# Patient Record
Sex: Male | Born: 1945 | Race: White | Hispanic: No | Marital: Single | Smoking: Current some day smoker
Health system: Southern US, Community
[De-identification: ages and names within clinical notes are randomized; demographics above are authoritative.]

## PROBLEM LIST (undated history)

## (undated) DIAGNOSIS — K219 Gastro-esophageal reflux disease without esophagitis: Secondary | ICD-10-CM

## (undated) DIAGNOSIS — J449 Chronic obstructive pulmonary disease, unspecified: Secondary | ICD-10-CM

---

## 2011-04-16 DIAGNOSIS — I739 Peripheral vascular disease, unspecified: Secondary | ICD-10-CM | POA: Diagnosis not present

## 2011-04-16 DIAGNOSIS — B351 Tinea unguium: Secondary | ICD-10-CM | POA: Diagnosis not present

## 2011-05-09 DIAGNOSIS — F411 Generalized anxiety disorder: Secondary | ICD-10-CM | POA: Diagnosis not present

## 2011-05-09 DIAGNOSIS — K219 Gastro-esophageal reflux disease without esophagitis: Secondary | ICD-10-CM | POA: Diagnosis not present

## 2011-05-09 DIAGNOSIS — J438 Other emphysema: Secondary | ICD-10-CM | POA: Diagnosis not present

## 2011-05-09 DIAGNOSIS — J449 Chronic obstructive pulmonary disease, unspecified: Secondary | ICD-10-CM | POA: Diagnosis not present

## 2011-06-25 DIAGNOSIS — B351 Tinea unguium: Secondary | ICD-10-CM | POA: Diagnosis not present

## 2011-06-25 DIAGNOSIS — I739 Peripheral vascular disease, unspecified: Secondary | ICD-10-CM | POA: Diagnosis not present

## 2011-08-09 DIAGNOSIS — E782 Mixed hyperlipidemia: Secondary | ICD-10-CM | POA: Diagnosis not present

## 2011-08-09 DIAGNOSIS — J449 Chronic obstructive pulmonary disease, unspecified: Secondary | ICD-10-CM | POA: Diagnosis not present

## 2011-09-03 DIAGNOSIS — B351 Tinea unguium: Secondary | ICD-10-CM | POA: Diagnosis not present

## 2011-09-03 DIAGNOSIS — E109 Type 1 diabetes mellitus without complications: Secondary | ICD-10-CM | POA: Diagnosis not present

## 2011-11-11 DIAGNOSIS — J449 Chronic obstructive pulmonary disease, unspecified: Secondary | ICD-10-CM | POA: Diagnosis not present

## 2012-03-12 DIAGNOSIS — J449 Chronic obstructive pulmonary disease, unspecified: Secondary | ICD-10-CM | POA: Diagnosis not present

## 2012-07-10 DIAGNOSIS — J449 Chronic obstructive pulmonary disease, unspecified: Secondary | ICD-10-CM | POA: Diagnosis not present

## 2012-10-13 DIAGNOSIS — J449 Chronic obstructive pulmonary disease, unspecified: Secondary | ICD-10-CM | POA: Diagnosis not present

## 2012-10-30 DIAGNOSIS — I739 Peripheral vascular disease, unspecified: Secondary | ICD-10-CM | POA: Diagnosis not present

## 2012-10-30 DIAGNOSIS — L851 Acquired keratosis [keratoderma] palmaris et plantaris: Secondary | ICD-10-CM | POA: Diagnosis not present

## 2012-10-30 DIAGNOSIS — L609 Nail disorder, unspecified: Secondary | ICD-10-CM | POA: Diagnosis not present

## 2012-10-30 DIAGNOSIS — B351 Tinea unguium: Secondary | ICD-10-CM | POA: Diagnosis not present

## 2012-10-30 DIAGNOSIS — E1149 Type 2 diabetes mellitus with other diabetic neurological complication: Secondary | ICD-10-CM | POA: Diagnosis not present

## 2013-01-01 DIAGNOSIS — L851 Acquired keratosis [keratoderma] palmaris et plantaris: Secondary | ICD-10-CM | POA: Diagnosis not present

## 2013-01-01 DIAGNOSIS — E1149 Type 2 diabetes mellitus with other diabetic neurological complication: Secondary | ICD-10-CM | POA: Diagnosis not present

## 2013-01-01 DIAGNOSIS — L609 Nail disorder, unspecified: Secondary | ICD-10-CM | POA: Diagnosis not present

## 2013-01-14 DIAGNOSIS — Z125 Encounter for screening for malignant neoplasm of prostate: Secondary | ICD-10-CM | POA: Diagnosis not present

## 2013-01-14 DIAGNOSIS — J449 Chronic obstructive pulmonary disease, unspecified: Secondary | ICD-10-CM | POA: Diagnosis not present

## 2013-04-16 DIAGNOSIS — J449 Chronic obstructive pulmonary disease, unspecified: Secondary | ICD-10-CM | POA: Diagnosis not present

## 2013-05-07 DIAGNOSIS — B351 Tinea unguium: Secondary | ICD-10-CM | POA: Diagnosis not present

## 2013-05-07 DIAGNOSIS — L851 Acquired keratosis [keratoderma] palmaris et plantaris: Secondary | ICD-10-CM | POA: Diagnosis not present

## 2013-07-20 DIAGNOSIS — J449 Chronic obstructive pulmonary disease, unspecified: Secondary | ICD-10-CM | POA: Diagnosis not present

## 2013-07-20 DIAGNOSIS — J069 Acute upper respiratory infection, unspecified: Secondary | ICD-10-CM | POA: Diagnosis not present

## 2013-07-23 DIAGNOSIS — B351 Tinea unguium: Secondary | ICD-10-CM | POA: Diagnosis not present

## 2013-07-23 DIAGNOSIS — L851 Acquired keratosis [keratoderma] palmaris et plantaris: Secondary | ICD-10-CM | POA: Diagnosis not present

## 2013-07-23 DIAGNOSIS — E1149 Type 2 diabetes mellitus with other diabetic neurological complication: Secondary | ICD-10-CM | POA: Diagnosis not present

## 2013-08-12 ENCOUNTER — Other Ambulatory Visit (HOSPITAL_COMMUNITY): Payer: Self-pay | Admitting: Internal Medicine

## 2013-08-12 ENCOUNTER — Ambulatory Visit (HOSPITAL_COMMUNITY)
Admission: RE | Admit: 2013-08-12 | Discharge: 2013-08-12 | Disposition: A | Discharge status: Home / Self Care | Payer: Medicare Other | Source: Ambulatory Visit | Attending: Internal Medicine | Admitting: Internal Medicine

## 2013-08-12 DIAGNOSIS — R05 Cough: Secondary | ICD-10-CM | POA: Diagnosis not present

## 2013-08-12 DIAGNOSIS — R059 Cough, unspecified: Secondary | ICD-10-CM

## 2013-10-19 DIAGNOSIS — J449 Chronic obstructive pulmonary disease, unspecified: Secondary | ICD-10-CM | POA: Diagnosis not present

## 2013-10-19 DIAGNOSIS — IMO0002 Reserved for concepts with insufficient information to code with codable children: Secondary | ICD-10-CM | POA: Diagnosis not present

## 2013-12-07 DIAGNOSIS — L851 Acquired keratosis [keratoderma] palmaris et plantaris: Secondary | ICD-10-CM | POA: Diagnosis not present

## 2013-12-07 DIAGNOSIS — E1142 Type 2 diabetes mellitus with diabetic polyneuropathy: Secondary | ICD-10-CM | POA: Diagnosis not present

## 2013-12-07 DIAGNOSIS — B351 Tinea unguium: Secondary | ICD-10-CM | POA: Diagnosis not present

## 2014-01-18 DIAGNOSIS — J441 Chronic obstructive pulmonary disease with (acute) exacerbation: Secondary | ICD-10-CM | POA: Diagnosis not present

## 2014-01-18 DIAGNOSIS — Z0000 Encounter for general adult medical examination without abnormal findings: Secondary | ICD-10-CM | POA: Diagnosis not present

## 2014-01-18 DIAGNOSIS — Z125 Encounter for screening for malignant neoplasm of prostate: Secondary | ICD-10-CM | POA: Diagnosis not present

## 2014-03-01 DIAGNOSIS — B351 Tinea unguium: Secondary | ICD-10-CM | POA: Diagnosis not present

## 2014-03-01 DIAGNOSIS — L851 Acquired keratosis [keratoderma] palmaris et plantaris: Secondary | ICD-10-CM | POA: Diagnosis not present

## 2014-03-01 DIAGNOSIS — E1142 Type 2 diabetes mellitus with diabetic polyneuropathy: Secondary | ICD-10-CM | POA: Diagnosis not present

## 2014-05-03 DIAGNOSIS — J441 Chronic obstructive pulmonary disease with (acute) exacerbation: Secondary | ICD-10-CM | POA: Diagnosis not present

## 2014-05-10 DIAGNOSIS — L851 Acquired keratosis [keratoderma] palmaris et plantaris: Secondary | ICD-10-CM | POA: Diagnosis not present

## 2014-05-10 DIAGNOSIS — E1142 Type 2 diabetes mellitus with diabetic polyneuropathy: Secondary | ICD-10-CM | POA: Diagnosis not present

## 2014-05-10 DIAGNOSIS — B351 Tinea unguium: Secondary | ICD-10-CM | POA: Diagnosis not present

## 2014-07-22 DIAGNOSIS — B351 Tinea unguium: Secondary | ICD-10-CM | POA: Diagnosis not present

## 2014-07-22 DIAGNOSIS — L851 Acquired keratosis [keratoderma] palmaris et plantaris: Secondary | ICD-10-CM | POA: Diagnosis not present

## 2014-07-22 DIAGNOSIS — E1142 Type 2 diabetes mellitus with diabetic polyneuropathy: Secondary | ICD-10-CM | POA: Diagnosis not present

## 2014-08-05 DIAGNOSIS — J441 Chronic obstructive pulmonary disease with (acute) exacerbation: Secondary | ICD-10-CM | POA: Diagnosis not present

## 2014-08-05 DIAGNOSIS — K210 Gastro-esophageal reflux disease with esophagitis: Secondary | ICD-10-CM | POA: Diagnosis not present

## 2014-08-05 DIAGNOSIS — E782 Mixed hyperlipidemia: Secondary | ICD-10-CM | POA: Diagnosis not present

## 2014-09-08 ENCOUNTER — Emergency Department (HOSPITAL_COMMUNITY)
Admission: EM | Admit: 2014-09-08 | Discharge: 2014-09-08 | Disposition: A | Discharge status: Home / Self Care | Payer: Medicare Other | Attending: Emergency Medicine | Admitting: Emergency Medicine

## 2014-09-08 ENCOUNTER — Encounter (HOSPITAL_COMMUNITY): Payer: Self-pay | Admitting: Emergency Medicine

## 2014-09-08 ENCOUNTER — Emergency Department (HOSPITAL_COMMUNITY): Payer: Medicare Other

## 2014-09-08 DIAGNOSIS — Y9289 Other specified places as the place of occurrence of the external cause: Secondary | ICD-10-CM | POA: Diagnosis not present

## 2014-09-08 DIAGNOSIS — Z720 Tobacco use: Secondary | ICD-10-CM | POA: Diagnosis not present

## 2014-09-08 DIAGNOSIS — Y9389 Activity, other specified: Secondary | ICD-10-CM | POA: Diagnosis not present

## 2014-09-08 DIAGNOSIS — J449 Chronic obstructive pulmonary disease, unspecified: Secondary | ICD-10-CM | POA: Diagnosis not present

## 2014-09-08 DIAGNOSIS — K219 Gastro-esophageal reflux disease without esophagitis: Secondary | ICD-10-CM | POA: Diagnosis not present

## 2014-09-08 DIAGNOSIS — M25512 Pain in left shoulder: Secondary | ICD-10-CM | POA: Diagnosis not present

## 2014-09-08 DIAGNOSIS — S42252A Displaced fracture of greater tuberosity of left humerus, initial encounter for closed fracture: Secondary | ICD-10-CM | POA: Insufficient documentation

## 2014-09-08 DIAGNOSIS — Y998 Other external cause status: Secondary | ICD-10-CM | POA: Diagnosis not present

## 2014-09-08 DIAGNOSIS — S4992XA Unspecified injury of left shoulder and upper arm, initial encounter: Secondary | ICD-10-CM | POA: Diagnosis present

## 2014-09-08 DIAGNOSIS — Z79899 Other long term (current) drug therapy: Secondary | ICD-10-CM | POA: Diagnosis not present

## 2014-09-08 DIAGNOSIS — W1789XA Other fall from one level to another, initial encounter: Secondary | ICD-10-CM | POA: Diagnosis not present

## 2014-09-08 DIAGNOSIS — S42302A Unspecified fracture of shaft of humerus, left arm, initial encounter for closed fracture: Secondary | ICD-10-CM

## 2014-09-08 HISTORY — DX: Gastro-esophageal reflux disease without esophagitis: K21.9

## 2014-09-08 HISTORY — DX: Chronic obstructive pulmonary disease, unspecified: J44.9

## 2014-09-08 MED ORDER — HYDROCODONE-ACETAMINOPHEN 5-325 MG PO TABS
1.0000 | ORAL_TABLET | Freq: Once | ORAL | Status: AC
  Administered 2014-09-08: 1 via ORAL
  Filled 2014-09-08: qty 1

## 2014-09-08 MED ORDER — HYDROCODONE-ACETAMINOPHEN 5-325 MG PO TABS: 1.0000 | ORAL_TABLET | ORAL | Status: DC | PRN

## 2014-09-08 NOTE — ED Notes (Signed)
Pt fell today.  Injury to left arm.  Rates pain 5/10.  Pt is from [facility].

## 2014-09-08 NOTE — ED Provider Notes (Signed)
Patient seen and examined.  History of a fall from standing height today.  Exam with TTP at LUE near shoulder.  + sensation to axillary nerve, and throughout LUE. Xrays reviewed by me. Interpretation is comminuted Prox humerus and GT fracture.  Discussed tx by immobilization, pain control, and ortho F/U.  Rolland Porter, MD 09/08/14 1451

## 2014-09-08 NOTE — Discharge Instructions (Signed)
Humerus Fracture, Treated with Immobilization °The humerus is the large bone in the upper arm. A broken (fractured) humerus is often treated by wearing a cast, splint, or sling (immobilization). This holds the broken pieces in place so they can heal.  °HOME CARE °· Put ice on the injured area. °¨ Put ice in a plastic bag. °¨ Place a towel between your skin and the bag. °¨ Leave the ice on for 15-20 minutes, 03-04 times a day. °· If you are given a cast: °¨ Do not scratch the skin under the cast. °¨ Check the skin around the cast every day. You may put lotion on any red or sore areas. °¨ Keep the cast dry and clean. °· If you are given a splint: °¨ Wear the splint as told. °¨ Keep the splint clean and dry. °¨ Loosen the elastic around the splint if your fingers become numb, cold, tingle, or turn blue. °· If you are given a sling: °¨ Wear the sling as told. °· Do not put pressure on any part of the cast or splint until it is fully hardened. °· The cast or splint must be protected with a plastic bag during bathing. Do not lower the cast or splint into water. °· Only take medicine as told by your doctor. °· Do exercises as told by your doctor. °· Follow up as told by your doctor. °GET HELP RIGHT AWAY IF:  °· Your skin or fingernails turn blue or gray. °· Your arm feels cold or numb. °· You have very bad pain in the injured arm. °· You are having problems with the medicines you were given. °MAKE SURE YOU:  °· Understand these instructions. °· Will watch your condition. °· Will get help right away if you are not doing well or get worse. °Document Released: 07/17/2007 Document Revised: 04/22/2011 Document Reviewed: 03/14/2010 °Exit[employer]® Patient Information ©2015 [employer]. This information is not intended to replace advice given to you by your health care provider. Make sure you discuss any questions you have with your health care provider. ° °

## 2014-09-09 DIAGNOSIS — Z6826 Body mass index (BMI) 26.0-26.9, adult: Secondary | ICD-10-CM | POA: Diagnosis not present

## 2014-09-09 DIAGNOSIS — F17200 Nicotine dependence, unspecified, uncomplicated: Secondary | ICD-10-CM | POA: Diagnosis not present

## 2014-09-09 DIAGNOSIS — S42212A Unspecified displaced fracture of surgical neck of left humerus, initial encounter for closed fracture: Secondary | ICD-10-CM | POA: Diagnosis not present

## 2014-09-16 DIAGNOSIS — S42212D Unspecified displaced fracture of surgical neck of left humerus, subsequent encounter for fracture with routine healing: Secondary | ICD-10-CM | POA: Diagnosis not present

## 2014-09-16 DIAGNOSIS — F17200 Nicotine dependence, unspecified, uncomplicated: Secondary | ICD-10-CM | POA: Diagnosis not present

## 2014-09-16 DIAGNOSIS — Z6826 Body mass index (BMI) 26.0-26.9, adult: Secondary | ICD-10-CM | POA: Diagnosis not present

## 2014-09-26 NOTE — ED Provider Notes (Signed)
CSN: [id number]     Arrival date & time 09/08/14  1251 History   First MD Initiated Contact with Patient 09/08/14 1257     Chief Complaint  Patient presents with  . Fall  . Arm Pain     (Consider location/radiation/quality/duration/timing/severity/associated sxs/prior Treatment) The history is provided by the patient.   Thomas Foley is a 69 y.o. male presenting from a local [facility] presenting with left upper arm pain after tripping and falling from standing height with the majority of is weight landing on his left shoulder.  He denies any other pain and did not hit his head, denies loc.  He has no pain in his neck or back and denies left distal arm pain, weakness or numbness.  He has had no treatment prior to arrival.     Past Medical History  Diagnosis Date  . COPD (chronic obstructive pulmonary disease)   . GERD (gastroesophageal reflux disease)    History reviewed. No pertinent past surgical history. No family history on file. Social History  Substance Use Topics  . Smoking status: Current Some Day Smoker  . Smokeless tobacco: None  . Alcohol Use: No    Review of Systems  Constitutional: Negative for fever.  Musculoskeletal: Positive for arthralgias. Negative for myalgias and joint swelling.  Neurological: Negative for weakness and numbness.      Allergies  Review of patient's allergies indicates no known allergies.  Home Medications   Prior to Admission medications   Medication Sig Start Date End Date Taking? Authorizing Provider  acetaminophen (TYLENOL) 500 MG tablet Take 500 mg by mouth every 6 (six) hours as needed for moderate pain.   Yes Historical Provider, MD  benzonatate (TESSALON) 200 MG capsule Take 200 mg by mouth 3 (three) times daily as needed for cough.   Yes Historical Provider, MD  benztropine (COGENTIN) 1 MG tablet Take 1 tablet by mouth daily. 08/19/14  Yes Historical Provider, MD  folic acid (FOLVITE) 1 MG tablet Take 1 mg by mouth  daily.   Yes Historical Provider, MD  metoCLOPramide (REGLAN) 5 MG tablet Take 1 tablet by mouth 4 (four) times daily. 08/19/14  Yes Historical Provider, MD  OLANZapine (ZYPREXA) 5 MG tablet Take 1 tablet by mouth at bedtime. 08/19/14  Yes Historical Provider, MD  omeprazole (PRILOSEC) 20 MG capsule Take 1 capsule by mouth daily. 08/19/14  Yes Historical Provider, MD  HYDROcodone-acetaminophen (NORCO/VICODIN) 5-325 MG per tablet Take 1 tablet by mouth every 4 (four) hours as needed. 09/08/14   Burgess Amor, PA-C   BP 130/78 mmHg  Pulse 76  Temp(Src) 98.6 F (37 C) (Oral)  Resp 18  Ht 6' (1.829 m)  Wt 170 lb (77.111 kg)  BMI 23.05 kg/m2  SpO2 99% Physical Exam  Constitutional: He appears well-developed and well-nourished.  HENT:  Head: Atraumatic.  Neck: Normal range of motion.  Cardiovascular:  Pulses equal bilaterally  Musculoskeletal: He exhibits tenderness.       Left shoulder: He exhibits bony tenderness and swelling. He exhibits no laceration, no spasm, normal pulse and normal strength.  No clavicle pain or deformity  Neurological: He is alert. He has normal strength. He displays normal reflexes. No sensory deficit.  Equal grip strength.  No pain in lower left upper extremity.  Distal sensation intact.  Normal sensation lateral shoulder.  Skin: Skin is warm and dry.  Psychiatric: He has a normal mood and affect.    ED Course  Procedures (including critical care time)  Imaging Review No results found.  No results found for this or any previous visit. Dg Shoulder Left  09/08/2014   CLINICAL DATA:  Left shoulder pain, swelling and bruising following a fall this morning.  EXAM: LEFT SHOULDER - 2+ VIEW  COMPARISON:  None.  FINDINGS: Comminuted fracture of the left humeral neck and greater tuberosity with mild medial displacement and lateral angulation of the distal fragment. No dislocation seen.  IMPRESSION: Comminuted left humeral neck and greater tuberosity fracture.    Electronically Signed   By: Beckie Salts M.D.   On: 09/08/2014 13:47      MDM   Final diagnoses:  Humerus fracture, left, closed, initial encounter    Patients radiological studies were viewed, interpreted and considered during the medical decision making and disposition process. I agree with radiologists reading.  Results were also discussed with patient.   Discussed with Dr. Hilda Lias who will follow pt  - pt to call for appt in 1 day.  Sling immobilizer provided.  Hydrocodone.  Prn f/u.  Pt seen by Dr. [name] during this visit.      Burgess Amor, PA-C 09/26/14 1221  Rolland Porter, MD 10/04/14 2142

## 2014-10-07 DIAGNOSIS — S42212D Unspecified displaced fracture of surgical neck of left humerus, subsequent encounter for fracture with routine healing: Secondary | ICD-10-CM | POA: Diagnosis not present

## 2014-10-07 DIAGNOSIS — F17200 Nicotine dependence, unspecified, uncomplicated: Secondary | ICD-10-CM | POA: Diagnosis not present

## 2014-10-07 DIAGNOSIS — Z6826 Body mass index (BMI) 26.0-26.9, adult: Secondary | ICD-10-CM | POA: Diagnosis not present

## 2014-10-11 DIAGNOSIS — B351 Tinea unguium: Secondary | ICD-10-CM | POA: Diagnosis not present

## 2014-10-11 DIAGNOSIS — L851 Acquired keratosis [keratoderma] palmaris et plantaris: Secondary | ICD-10-CM | POA: Diagnosis not present

## 2014-10-11 DIAGNOSIS — E1142 Type 2 diabetes mellitus with diabetic polyneuropathy: Secondary | ICD-10-CM | POA: Diagnosis not present

## 2014-10-27 DIAGNOSIS — F17200 Nicotine dependence, unspecified, uncomplicated: Secondary | ICD-10-CM | POA: Diagnosis not present

## 2014-10-27 DIAGNOSIS — S42212D Unspecified displaced fracture of surgical neck of left humerus, subsequent encounter for fracture with routine healing: Secondary | ICD-10-CM | POA: Diagnosis not present

## 2014-10-27 DIAGNOSIS — Z6826 Body mass index (BMI) 26.0-26.9, adult: Secondary | ICD-10-CM | POA: Diagnosis not present

## 2014-11-08 DIAGNOSIS — E782 Mixed hyperlipidemia: Secondary | ICD-10-CM | POA: Diagnosis not present

## 2014-11-08 DIAGNOSIS — K210 Gastro-esophageal reflux disease with esophagitis: Secondary | ICD-10-CM | POA: Diagnosis not present

## 2014-11-08 DIAGNOSIS — J441 Chronic obstructive pulmonary disease with (acute) exacerbation: Secondary | ICD-10-CM | POA: Diagnosis not present

## 2014-11-29 DIAGNOSIS — S42212D Unspecified displaced fracture of surgical neck of left humerus, subsequent encounter for fracture with routine healing: Secondary | ICD-10-CM | POA: Diagnosis not present

## 2014-11-29 DIAGNOSIS — Z6826 Body mass index (BMI) 26.0-26.9, adult: Secondary | ICD-10-CM | POA: Diagnosis not present

## 2014-11-29 DIAGNOSIS — F17200 Nicotine dependence, unspecified, uncomplicated: Secondary | ICD-10-CM | POA: Diagnosis not present

## 2014-12-13 DIAGNOSIS — Z23 Encounter for immunization: Secondary | ICD-10-CM | POA: Diagnosis not present

## 2014-12-23 DIAGNOSIS — L851 Acquired keratosis [keratoderma] palmaris et plantaris: Secondary | ICD-10-CM | POA: Diagnosis not present

## 2014-12-23 DIAGNOSIS — E1142 Type 2 diabetes mellitus with diabetic polyneuropathy: Secondary | ICD-10-CM | POA: Diagnosis not present

## 2014-12-23 DIAGNOSIS — B351 Tinea unguium: Secondary | ICD-10-CM | POA: Diagnosis not present

## 2015-02-07 DIAGNOSIS — Z1389 Encounter for screening for other disorder: Secondary | ICD-10-CM | POA: Diagnosis not present

## 2015-02-07 DIAGNOSIS — Z13220 Encounter for screening for lipoid disorders: Secondary | ICD-10-CM | POA: Diagnosis not present

## 2015-02-07 DIAGNOSIS — Z0000 Encounter for general adult medical examination without abnormal findings: Secondary | ICD-10-CM | POA: Diagnosis not present

## 2015-02-07 DIAGNOSIS — J440 Chronic obstructive pulmonary disease with acute lower respiratory infection: Secondary | ICD-10-CM | POA: Diagnosis not present

## 2015-03-01 DIAGNOSIS — F17200 Nicotine dependence, unspecified, uncomplicated: Secondary | ICD-10-CM | POA: Diagnosis not present

## 2015-03-01 DIAGNOSIS — F419 Anxiety disorder, unspecified: Secondary | ICD-10-CM | POA: Diagnosis not present

## 2015-03-01 DIAGNOSIS — K449 Diaphragmatic hernia without obstruction or gangrene: Secondary | ICD-10-CM | POA: Diagnosis not present

## 2015-03-01 DIAGNOSIS — Z79899 Other long term (current) drug therapy: Secondary | ICD-10-CM | POA: Diagnosis not present

## 2015-03-01 DIAGNOSIS — J449 Chronic obstructive pulmonary disease, unspecified: Secondary | ICD-10-CM | POA: Diagnosis not present

## 2015-03-01 DIAGNOSIS — F209 Schizophrenia, unspecified: Secondary | ICD-10-CM | POA: Diagnosis not present

## 2015-03-01 DIAGNOSIS — M8589 Other specified disorders of bone density and structure, multiple sites: Secondary | ICD-10-CM | POA: Diagnosis not present

## 2015-03-01 DIAGNOSIS — M810 Age-related osteoporosis without current pathological fracture: Secondary | ICD-10-CM | POA: Diagnosis not present

## 2015-04-11 DIAGNOSIS — L851 Acquired keratosis [keratoderma] palmaris et plantaris: Secondary | ICD-10-CM | POA: Diagnosis not present

## 2015-04-11 DIAGNOSIS — B351 Tinea unguium: Secondary | ICD-10-CM | POA: Diagnosis not present

## 2015-04-11 DIAGNOSIS — E1142 Type 2 diabetes mellitus with diabetic polyneuropathy: Secondary | ICD-10-CM | POA: Diagnosis not present

## 2015-06-30 DIAGNOSIS — B351 Tinea unguium: Secondary | ICD-10-CM | POA: Diagnosis not present

## 2015-06-30 DIAGNOSIS — E1142 Type 2 diabetes mellitus with diabetic polyneuropathy: Secondary | ICD-10-CM | POA: Diagnosis not present

## 2015-06-30 DIAGNOSIS — L851 Acquired keratosis [keratoderma] palmaris et plantaris: Secondary | ICD-10-CM | POA: Diagnosis not present

## 2015-07-07 DIAGNOSIS — Z13220 Encounter for screening for lipoid disorders: Secondary | ICD-10-CM | POA: Diagnosis not present

## 2015-07-07 DIAGNOSIS — F2089 Other schizophrenia: Secondary | ICD-10-CM | POA: Diagnosis not present

## 2015-07-07 DIAGNOSIS — R5383 Other fatigue: Secondary | ICD-10-CM | POA: Diagnosis not present

## 2015-07-07 DIAGNOSIS — Z125 Encounter for screening for malignant neoplasm of prostate: Secondary | ICD-10-CM | POA: Diagnosis not present

## 2015-07-07 DIAGNOSIS — K210 Gastro-esophageal reflux disease with esophagitis: Secondary | ICD-10-CM | POA: Diagnosis not present

## 2015-07-07 DIAGNOSIS — J440 Chronic obstructive pulmonary disease with acute lower respiratory infection: Secondary | ICD-10-CM | POA: Diagnosis not present

## 2015-07-07 DIAGNOSIS — Z0000 Encounter for general adult medical examination without abnormal findings: Secondary | ICD-10-CM | POA: Diagnosis not present

## 2015-09-08 DIAGNOSIS — E1142 Type 2 diabetes mellitus with diabetic polyneuropathy: Secondary | ICD-10-CM | POA: Diagnosis not present

## 2015-09-08 DIAGNOSIS — B351 Tinea unguium: Secondary | ICD-10-CM | POA: Diagnosis not present

## 2015-09-08 DIAGNOSIS — L851 Acquired keratosis [keratoderma] palmaris et plantaris: Secondary | ICD-10-CM | POA: Diagnosis not present

## 2015-10-09 DIAGNOSIS — K210 Gastro-esophageal reflux disease with esophagitis: Secondary | ICD-10-CM | POA: Diagnosis not present

## 2015-10-09 DIAGNOSIS — J440 Chronic obstructive pulmonary disease with acute lower respiratory infection: Secondary | ICD-10-CM | POA: Diagnosis not present

## 2015-10-09 DIAGNOSIS — F2089 Other schizophrenia: Secondary | ICD-10-CM | POA: Diagnosis not present

## 2015-11-17 DIAGNOSIS — L851 Acquired keratosis [keratoderma] palmaris et plantaris: Secondary | ICD-10-CM | POA: Diagnosis not present

## 2015-11-17 DIAGNOSIS — B351 Tinea unguium: Secondary | ICD-10-CM | POA: Diagnosis not present

## 2015-11-17 DIAGNOSIS — E1142 Type 2 diabetes mellitus with diabetic polyneuropathy: Secondary | ICD-10-CM | POA: Diagnosis not present

## 2015-11-22 ENCOUNTER — Encounter (HOSPITAL_COMMUNITY): Payer: Self-pay | Admitting: Emergency Medicine

## 2015-11-22 ENCOUNTER — Emergency Department (HOSPITAL_COMMUNITY)
Admission: EM | Admit: 2015-11-22 | Discharge: 2015-11-22 | Disposition: A | Discharge status: Home / Self Care | Payer: Medicare Other | Attending: Emergency Medicine | Admitting: Emergency Medicine

## 2015-11-22 ENCOUNTER — Emergency Department (HOSPITAL_COMMUNITY): Payer: Medicare Other

## 2015-11-22 DIAGNOSIS — J449 Chronic obstructive pulmonary disease, unspecified: Secondary | ICD-10-CM | POA: Diagnosis not present

## 2015-11-22 DIAGNOSIS — W010XXA Fall on same level from slipping, tripping and stumbling without subsequent striking against object, initial encounter: Secondary | ICD-10-CM | POA: Insufficient documentation

## 2015-11-22 DIAGNOSIS — R4182 Altered mental status, unspecified: Secondary | ICD-10-CM | POA: Diagnosis not present

## 2015-11-22 DIAGNOSIS — S59902A Unspecified injury of left elbow, initial encounter: Secondary | ICD-10-CM | POA: Diagnosis present

## 2015-11-22 DIAGNOSIS — Y939 Activity, unspecified: Secondary | ICD-10-CM | POA: Insufficient documentation

## 2015-11-22 DIAGNOSIS — Y999 Unspecified external cause status: Secondary | ICD-10-CM | POA: Insufficient documentation

## 2015-11-22 DIAGNOSIS — Y929 Unspecified place or not applicable: Secondary | ICD-10-CM | POA: Insufficient documentation

## 2015-11-22 DIAGNOSIS — S42402A Unspecified fracture of lower end of left humerus, initial encounter for closed fracture: Secondary | ICD-10-CM | POA: Insufficient documentation

## 2015-11-22 DIAGNOSIS — W19XXXA Unspecified fall, initial encounter: Secondary | ICD-10-CM

## 2015-11-22 DIAGNOSIS — F17200 Nicotine dependence, unspecified, uncomplicated: Secondary | ICD-10-CM | POA: Insufficient documentation

## 2015-11-22 DIAGNOSIS — S42492A Other displaced fracture of lower end of left humerus, initial encounter for closed fracture: Secondary | ICD-10-CM | POA: Diagnosis not present

## 2015-11-22 DIAGNOSIS — M25522 Pain in left elbow: Secondary | ICD-10-CM | POA: Diagnosis not present

## 2015-11-22 MED ORDER — OXYCODONE-ACETAMINOPHEN 5-325 MG PO TABS: 1.0000 | ORAL_TABLET | Freq: Four times a day (QID) | ORAL | 0 refills | Status: DC | PRN

## 2015-11-22 MED ORDER — OXYCODONE-ACETAMINOPHEN 5-325 MG PO TABS
1.0000 | ORAL_TABLET | Freq: Once | ORAL | Status: AC
  Administered 2015-11-22: 1 via ORAL
  Filled 2015-11-22: qty 1

## 2015-11-22 MED ORDER — SODIUM CHLORIDE 0.9 % IV BOLUS (SEPSIS): 30.0000 mL/kg | Freq: Once | INTRAVENOUS | Status: DC

## 2015-11-22 NOTE — ED Provider Notes (Signed)
[facility]-EMERGENCY DEPT Provider Note   CSN: [id number] Arrival date & time: 11/22/15  1950  By signing my name below, I, Linna DarnerRussell Turner, attest that this documentation has been prepared under the direction and in the presence of physician practitioner, Bethann BerkshireJoseph Siomara Burkel, MD. Electronically Signed: Linna Darnerussell Turner, Scribe. 11/22/2015. 8:54 PM.  History   Chief Complaint Chief Complaint  Patient presents with  . Fall    The patient fell at the assisted living and landed on his left arm. Patient has pain in that arm no other places   The history is provided by the patient and the nursing home. No language interpreter was used.  Fall  This is a new problem. The current episode started 1 to 2 hours ago. The problem occurs rarely. The problem has not changed since onset.Pertinent negatives include no chest pain, no abdominal pain, no headaches and no shortness of breath. Nothing aggravates the symptoms. Nothing relieves the symptoms. He has tried nothing for the symptoms.     HPI Comments: LEVEL 5 CAVEAT FOR ALTERED MENTAL STATUS Thomas JarvisJames Foley is a 70 y.o. male brought in by nursing home staff who presents to the Emergency Department complaining of sudden onset, constant, left elbow pain s/p unwitnessed mechanical fall occurring shortly PTA. Pt notes associated swelling. He states he tripped and landed on his left elbow; he denies hitting his head or losing consciousness. He further denies headache, numbness, or any other associated symptoms.  Past Medical History:  Diagnosis Date  . COPD (chronic obstructive pulmonary disease) (HCC)   . GERD (gastroesophageal reflux disease)     There are no active problems to display for this patient.   History reviewed. No pertinent surgical history.     Home Medications    Prior to Admission medications   Medication Sig Start Date End Date Taking? Authorizing Provider  acetaminophen (TYLENOL) 500 MG tablet Take 500 mg by mouth every 6 (six) hours as  needed for moderate pain.    Historical Provider, MD  benzonatate (TESSALON) 200 MG capsule Take 200 mg by mouth 3 (three) times daily as needed for cough.    Historical Provider, MD  benztropine (COGENTIN) 1 MG tablet Take 1 tablet by mouth daily. 08/19/14   Historical Provider, MD  folic acid (FOLVITE) 1 MG tablet Take 1 mg by mouth daily.    Historical Provider, MD  HYDROcodone-acetaminophen (NORCO/VICODIN) 5-325 MG per tablet Take 1 tablet by mouth every 4 (four) hours as needed. 09/08/14   Burgess AmorJulie Idol, PA-C  metoCLOPramide (REGLAN) 5 MG tablet Take 1 tablet by mouth 4 (four) times daily. 08/19/14   Historical Provider, MD  OLANZapine (ZYPREXA) 5 MG tablet Take 1 tablet by mouth at bedtime. 08/19/14   Historical Provider, MD  omeprazole (PRILOSEC) 20 MG capsule Take 1 capsule by mouth daily. 08/19/14   Historical Provider, MD    Family History No family history on file.  Social History Social History  Substance Use Topics  . Smoking status: Current Some Day Smoker  . Smokeless tobacco: Former NeurosurgeonUser  . Alcohol use No     Allergies   Review of patient's allergies indicates no known allergies.   Review of Systems Review of Systems  Unable to perform ROS: Mental status change  Constitutional: Negative for appetite change and fatigue.  HENT: Negative for congestion, ear discharge and sinus pressure.   Eyes: Negative for discharge.  Respiratory: Negative for cough and shortness of breath.   Cardiovascular: Negative for chest pain.  Gastrointestinal: Negative for abdominal  pain and diarrhea.  Genitourinary: Negative for frequency and hematuria.  Musculoskeletal: Positive for arthralgias (left elbow) and joint swelling (left elbow). Negative for back pain.  Skin: Negative for rash.  Neurological: Negative for seizures, syncope, numbness and headaches.  Psychiatric/Behavioral: Negative for hallucinations.    Physical Exam Updated Vital Signs BP 146/86 (BP Location: Right Arm)   Pulse 60    Temp 98.1 F (36.7 C) (Oral)   Resp 16   Ht 6' (1.829 m)   Wt 170 lb (77.1 kg)   SpO2 98%   BMI 23.06 kg/m   Physical Exam  Constitutional: He appears well-developed.  HENT:  Head: Normocephalic.  Eyes: Conjunctivae and EOM are normal. No scleral icterus.  Neck: Neck supple. No thyromegaly present.  Cardiovascular: Normal rate and regular rhythm.  Exam reveals no gallop and no friction rub.   No murmur heard. Pulmonary/Chest: No stridor. He has no wheezes. He has no rales. He exhibits no tenderness.  Abdominal: He exhibits no distension. There is no tenderness. There is no rebound.  Musculoskeletal: Normal range of motion.  Swelling and tenderness to left elbow  Lymphadenopathy:    He has no cervical adenopathy.  Neurological: He exhibits normal muscle tone. Coordination normal.  Oriented to person  Skin: No rash noted. No erythema.  Psychiatric: He has a normal mood and affect. His behavior is normal.    ED Treatments / Results  Labs (all labs ordered are listed, but only abnormal results are displayed) Labs Reviewed - No data to display  EKG  EKG Interpretation None       Radiology Dg Elbow Complete Left  Result Date: 11/22/2015 CLINICAL DATA:  Larey Seat today and injured left elbow. EXAM: LEFT ELBOW - COMPLETE 3+ VIEW COMPARISON:  None. FINDINGS: There is a displaced and comminuted fracture involving the distal humerus with posterior displacement. The elbow joint is maintained. No fracture of the radius or ulna is identified. IMPRESSION: Displaced distal humerus fracture with 1 shaft width of posterior displacement. Electronically Signed   By: Rudie Meyer M.D.   On: 11/22/2015 20:30    Procedures Procedures (including critical care time)  DIAGNOSTIC STUDIES: Oxygen Saturation is 98% on RA, normal by my interpretation.    COORDINATION OF CARE: 8:59 PM Discussed treatment plan with nursing home employee at bedside and she agreed to plan.  Medications Ordered  in ED Medications - No data to display   Initial Impression / Assessment and Plan / ED Course  I have reviewed the triage vital signs and the nursing notes.  Pertinent labs & imaging results that were available during my care of the patient were reviewed by me and considered in my medical decision making (see chart for details).  Clinical Course   Patient with a fractured distal left humerus. I spoke with orthopedic doctor who suggests we splint the patient and he will follow him up in the office and  Final Clinical Impressions(s) / ED Diagnoses   Final diagnoses:  None    New Prescriptions New Prescriptions   No medications on file     Bethann Berkshire, MD 11/22/15 2126

## 2015-11-22 NOTE — ED Triage Notes (Signed)
Pt states he tripped and fell. Pt denies any loc. Pt c/o left elbow pain.

## 2015-11-22 NOTE — Discharge Instructions (Signed)
Call Dr. Mort SawyersHarrison's office tomorrow morning to set up an appointment for follow-up

## 2015-11-23 ENCOUNTER — Encounter: Payer: Self-pay | Admitting: Orthopaedic Surgery

## 2015-11-23 ENCOUNTER — Ambulatory Visit (INDEPENDENT_AMBULATORY_CARE_PROVIDER_SITE_OTHER): Payer: Medicare Other | Admitting: Orthopaedic Surgery

## 2015-11-23 VITALS — BP 115/69 | HR 60 | Temp 97.3°F | Ht 70.0 in | Wt 170.0 lb

## 2015-11-23 DIAGNOSIS — S42492A Other displaced fracture of lower end of left humerus, initial encounter for closed fracture: Secondary | ICD-10-CM | POA: Diagnosis not present

## 2015-11-23 NOTE — Progress Notes (Signed)
Subjective: He broke his elbow    Patient ID: Thomas Foley, male    DOB: 1945-03-13, 70 y.o.   MRN: [id number]  HPI Patient is a resident at a local rest home.  He fell yesterday evening and hurt his left elbow.  He was seen at the ER.  X-rays showed: FINDINGS: There is a displaced and comminuted fracture involving the distal humerus with posterior displacement. The elbow joint is maintained. No fracture of the radius or ulna is identified.  IMPRESSION: Displaced distal humerus fracture with 1 shaft width of posterior Displacement.  He was given a sling and an Ace dressing.  The Ace dressing is not present this afternoon.  He had no other injury.  He has learning disability.  He is not very talkative.  I reviewed the x-rays.  He has need for surgery for the displaced distal humerus/elbow injury.  I have told the assistant from the rest home who is with him.  He will be seen at Dr. Merrilee Seashore office tomorrow morning for further evaluation and possible surgery.  I have stressed how important it is to go to the appointment.  He has been placed in a well padded posterior splint in the office today.   Review of Systems  HENT: Negative for congestion.   Respiratory: Negative for cough and shortness of breath.   Musculoskeletal: Positive for arthralgias and joint swelling.  Psychiatric/Behavioral: Positive for behavioral problems.   Past Medical History:  Diagnosis Date  . COPD (chronic obstructive pulmonary disease) (HCC)   . GERD (gastroesophageal reflux disease)     No past surgical history on file.  Current Outpatient Prescriptions on File Prior to Visit  Medication Sig Dispense Refill  . acetaminophen (TYLENOL) 500 MG tablet Take 500 mg by mouth every 6 (six) hours as needed for moderate pain.    . benzonatate (TESSALON) 200 MG capsule Take 200 mg by mouth 3 (three) times daily as needed for cough.    . benztropine (COGENTIN) 1 MG tablet Take 1 tablet by mouth daily.    .  Calcium Carbonate-Vitamin D (CALCIUM 600+D) 600-400 MG-UNIT tablet Take 1 tablet by mouth daily.    . folic acid (FOLVITE) 1 MG tablet Take 1 mg by mouth daily.    Marland Kitchen OLANZapine (ZYPREXA) 5 MG tablet Take 1 tablet by mouth at bedtime.    Marland Kitchen omeprazole (PRILOSEC) 20 MG capsule Take 1 capsule by mouth daily.    Marland Kitchen oxyCODONE-acetaminophen (PERCOCET/ROXICET) 5-325 MG tablet Take 1 tablet by mouth every 6 (six) hours as needed. 20 tablet 0   No current facility-administered medications on file prior to visit.     Social History   Social History  . Marital status: Single    Spouse name: N/A  . Number of children: N/A  . Years of education: N/A   Occupational History  . Not on file.   Social History Main Topics  . Smoking status: Current Some Day Smoker  . Smokeless tobacco: Former Neurosurgeon  . Alcohol use No  . Drug use: No  . Sexual activity: Not on file   Other Topics Concern  . Not on file   Social History Narrative  . No narrative on file    History of heart disease in the family. BP 115/69   Pulse 60   Temp 97.3 F (36.3 C)   Ht 5\' 10"  (1.778 m)   Wt 170 lb (77.1 kg)   BMI 24.39 kg/m       Objective:  Physical Exam  Constitutional: He is oriented to person, place, and time. He appears well-developed and well-nourished.  HENT:  Head: Normocephalic and atraumatic.  Eyes: Conjunctivae and EOM are normal. Pupils are equal, round, and reactive to light.  Neck: Normal range of motion. Neck supple.  Cardiovascular: Normal rate, regular rhythm and intact distal pulses.   Pulmonary/Chest: Effort normal.  Abdominal: Soft.  Musculoskeletal: He exhibits tenderness (Pain, deformity of the left elbow.  NV intact.  Very limited motion.  ).  Neurological: He is alert and oriented to person, place, and time. He has normal reflexes. No cranial nerve deficit. He exhibits normal muscle tone. Coordination normal.  Skin: Skin is warm and dry.  Psychiatric: He has a normal mood and  affect. His behavior is normal. Judgment and thought content normal.    He is placed in a posterior splint.      Assessment & Plan:   Encounter Diagnosis  Name Primary?  . Closed bicondylar fracture of distal end of left humerus, initial encounter Yes   He will be seen by Dr. Merlyn LotKuzma tomorrow.  Forms for rest home completed.  Call if any problem.  Precautions discussed.  Electronically Signed Darreld McleanWayne Bradie Sangiovanni, MD 10/12/20172:50 PM

## 2015-11-24 ENCOUNTER — Other Ambulatory Visit (HOSPITAL_COMMUNITY): Payer: Self-pay | Admitting: Orthopedic Surgery

## 2015-11-24 ENCOUNTER — Ambulatory Visit (HOSPITAL_COMMUNITY)
Admission: RE | Admit: 2015-11-24 | Discharge: 2015-11-24 | Disposition: A | Discharge status: Home / Self Care | Payer: Medicare Other | Source: Ambulatory Visit | Attending: Orthopedic Surgery | Admitting: Orthopedic Surgery

## 2015-11-24 DIAGNOSIS — W19XXXA Unspecified fall, initial encounter: Secondary | ICD-10-CM | POA: Insufficient documentation

## 2015-11-24 DIAGNOSIS — S42412A Displaced simple supracondylar fracture without intercondylar fracture of left humerus, initial encounter for closed fracture: Secondary | ICD-10-CM | POA: Insufficient documentation

## 2015-11-27 DIAGNOSIS — S42493A Other displaced fracture of lower end of unspecified humerus, initial encounter for closed fracture: Secondary | ICD-10-CM | POA: Diagnosis not present

## 2015-12-05 DIAGNOSIS — S42493A Other displaced fracture of lower end of unspecified humerus, initial encounter for closed fracture: Secondary | ICD-10-CM | POA: Diagnosis not present

## 2015-12-05 DIAGNOSIS — S42492A Other displaced fracture of lower end of left humerus, initial encounter for closed fracture: Secondary | ICD-10-CM | POA: Diagnosis not present

## 2015-12-05 DIAGNOSIS — S42412A Displaced simple supracondylar fracture without intercondylar fracture of left humerus, initial encounter for closed fracture: Secondary | ICD-10-CM | POA: Diagnosis not present

## 2015-12-26 DIAGNOSIS — S42302D Unspecified fracture of shaft of humerus, left arm, subsequent encounter for fracture with routine healing: Secondary | ICD-10-CM | POA: Diagnosis not present

## 2015-12-26 DIAGNOSIS — S42492A Other displaced fracture of lower end of left humerus, initial encounter for closed fracture: Secondary | ICD-10-CM | POA: Diagnosis not present

## 2015-12-26 DIAGNOSIS — S42402A Unspecified fracture of lower end of left humerus, initial encounter for closed fracture: Secondary | ICD-10-CM | POA: Diagnosis not present

## 2016-01-02 DIAGNOSIS — S42402A Unspecified fracture of lower end of left humerus, initial encounter for closed fracture: Secondary | ICD-10-CM | POA: Diagnosis not present

## 2016-01-02 DIAGNOSIS — S42493D Other displaced fracture of lower end of unspecified humerus, subsequent encounter for fracture with routine healing: Secondary | ICD-10-CM | POA: Diagnosis not present

## 2016-01-09 DIAGNOSIS — K210 Gastro-esophageal reflux disease with esophagitis: Secondary | ICD-10-CM | POA: Diagnosis not present

## 2016-01-09 DIAGNOSIS — F2089 Other schizophrenia: Secondary | ICD-10-CM | POA: Diagnosis not present

## 2016-01-09 DIAGNOSIS — J440 Chronic obstructive pulmonary disease with acute lower respiratory infection: Secondary | ICD-10-CM | POA: Diagnosis not present

## 2016-01-17 DIAGNOSIS — S42493D Other displaced fracture of lower end of unspecified humerus, subsequent encounter for fracture with routine healing: Secondary | ICD-10-CM | POA: Diagnosis not present

## 2016-01-17 DIAGNOSIS — S42452D Displaced fracture of lateral condyle of left humerus, subsequent encounter for fracture with routine healing: Secondary | ICD-10-CM | POA: Diagnosis not present

## 2016-01-26 DIAGNOSIS — L851 Acquired keratosis [keratoderma] palmaris et plantaris: Secondary | ICD-10-CM | POA: Diagnosis not present

## 2016-01-26 DIAGNOSIS — E1142 Type 2 diabetes mellitus with diabetic polyneuropathy: Secondary | ICD-10-CM | POA: Diagnosis not present

## 2016-01-26 DIAGNOSIS — B351 Tinea unguium: Secondary | ICD-10-CM | POA: Diagnosis not present

## 2016-02-14 ENCOUNTER — Ambulatory Visit (HOSPITAL_COMMUNITY): Payer: Medicare Other | Attending: Orthopedic Surgery | Admitting: Occupational Therapy

## 2016-04-05 DIAGNOSIS — E1142 Type 2 diabetes mellitus with diabetic polyneuropathy: Secondary | ICD-10-CM | POA: Diagnosis not present

## 2016-04-05 DIAGNOSIS — B351 Tinea unguium: Secondary | ICD-10-CM | POA: Diagnosis not present

## 2016-06-14 DIAGNOSIS — B351 Tinea unguium: Secondary | ICD-10-CM | POA: Diagnosis not present

## 2016-06-14 DIAGNOSIS — E1142 Type 2 diabetes mellitus with diabetic polyneuropathy: Secondary | ICD-10-CM | POA: Diagnosis not present

## 2016-08-15 DIAGNOSIS — F2089 Other schizophrenia: Secondary | ICD-10-CM | POA: Diagnosis not present

## 2016-08-15 DIAGNOSIS — K210 Gastro-esophageal reflux disease with esophagitis: Secondary | ICD-10-CM | POA: Diagnosis not present

## 2016-08-15 DIAGNOSIS — Z0000 Encounter for general adult medical examination without abnormal findings: Secondary | ICD-10-CM | POA: Diagnosis not present

## 2016-08-15 DIAGNOSIS — J440 Chronic obstructive pulmonary disease with acute lower respiratory infection: Secondary | ICD-10-CM | POA: Diagnosis not present

## 2016-08-23 DIAGNOSIS — E1142 Type 2 diabetes mellitus with diabetic polyneuropathy: Secondary | ICD-10-CM | POA: Diagnosis not present

## 2016-08-23 DIAGNOSIS — B351 Tinea unguium: Secondary | ICD-10-CM | POA: Diagnosis not present

## 2016-11-01 DIAGNOSIS — E1142 Type 2 diabetes mellitus with diabetic polyneuropathy: Secondary | ICD-10-CM | POA: Diagnosis not present

## 2016-11-01 DIAGNOSIS — B351 Tinea unguium: Secondary | ICD-10-CM | POA: Diagnosis not present

## 2017-01-07 DIAGNOSIS — F2089 Other schizophrenia: Secondary | ICD-10-CM | POA: Diagnosis not present

## 2017-01-07 DIAGNOSIS — J441 Chronic obstructive pulmonary disease with (acute) exacerbation: Secondary | ICD-10-CM | POA: Diagnosis not present

## 2017-01-07 DIAGNOSIS — K210 Gastro-esophageal reflux disease with esophagitis: Secondary | ICD-10-CM | POA: Diagnosis not present

## 2017-01-10 DIAGNOSIS — B351 Tinea unguium: Secondary | ICD-10-CM | POA: Diagnosis not present

## 2017-01-10 DIAGNOSIS — E1142 Type 2 diabetes mellitus with diabetic polyneuropathy: Secondary | ICD-10-CM | POA: Diagnosis not present

## 2017-03-21 DIAGNOSIS — E1142 Type 2 diabetes mellitus with diabetic polyneuropathy: Secondary | ICD-10-CM | POA: Diagnosis not present

## 2017-03-21 DIAGNOSIS — B351 Tinea unguium: Secondary | ICD-10-CM | POA: Diagnosis not present

## 2017-04-10 DIAGNOSIS — F2089 Other schizophrenia: Secondary | ICD-10-CM | POA: Diagnosis not present

## 2017-04-10 DIAGNOSIS — Z1389 Encounter for screening for other disorder: Secondary | ICD-10-CM | POA: Diagnosis not present

## 2017-04-10 DIAGNOSIS — Z0000 Encounter for general adult medical examination without abnormal findings: Secondary | ICD-10-CM | POA: Diagnosis not present

## 2017-04-10 DIAGNOSIS — J441 Chronic obstructive pulmonary disease with (acute) exacerbation: Secondary | ICD-10-CM | POA: Diagnosis not present

## 2017-04-10 DIAGNOSIS — K210 Gastro-esophageal reflux disease with esophagitis: Secondary | ICD-10-CM | POA: Diagnosis not present

## 2017-06-20 DIAGNOSIS — J45909 Unspecified asthma, uncomplicated: Secondary | ICD-10-CM | POA: Diagnosis not present

## 2017-06-20 DIAGNOSIS — K219 Gastro-esophageal reflux disease without esophagitis: Secondary | ICD-10-CM | POA: Diagnosis not present

## 2017-06-20 DIAGNOSIS — F419 Anxiety disorder, unspecified: Secondary | ICD-10-CM | POA: Diagnosis not present

## 2017-06-20 DIAGNOSIS — Z1211 Encounter for screening for malignant neoplasm of colon: Secondary | ICD-10-CM | POA: Diagnosis not present

## 2017-07-16 DIAGNOSIS — Z79899 Other long term (current) drug therapy: Secondary | ICD-10-CM | POA: Diagnosis not present

## 2017-07-16 DIAGNOSIS — K210 Gastro-esophageal reflux disease with esophagitis: Secondary | ICD-10-CM | POA: Diagnosis not present

## 2017-07-16 DIAGNOSIS — J441 Chronic obstructive pulmonary disease with (acute) exacerbation: Secondary | ICD-10-CM | POA: Diagnosis not present

## 2017-07-16 DIAGNOSIS — Z0000 Encounter for general adult medical examination without abnormal findings: Secondary | ICD-10-CM | POA: Diagnosis not present

## 2017-07-16 DIAGNOSIS — Z125 Encounter for screening for malignant neoplasm of prostate: Secondary | ICD-10-CM | POA: Diagnosis not present

## 2017-07-16 DIAGNOSIS — Z1389 Encounter for screening for other disorder: Secondary | ICD-10-CM | POA: Diagnosis not present

## 2017-07-16 DIAGNOSIS — F2089 Other schizophrenia: Secondary | ICD-10-CM | POA: Diagnosis not present

## 2017-07-16 DIAGNOSIS — Z131 Encounter for screening for diabetes mellitus: Secondary | ICD-10-CM | POA: Diagnosis not present

## 2017-10-21 DIAGNOSIS — H2513 Age-related nuclear cataract, bilateral: Secondary | ICD-10-CM | POA: Diagnosis not present

## 2018-01-12 ENCOUNTER — Other Ambulatory Visit: Payer: Self-pay

## 2018-01-12 ENCOUNTER — Encounter (HOSPITAL_COMMUNITY): Payer: Self-pay | Admitting: Emergency Medicine

## 2018-01-12 ENCOUNTER — Emergency Department (HOSPITAL_COMMUNITY): Payer: Medicare Other

## 2018-01-12 ENCOUNTER — Inpatient Hospital Stay (HOSPITAL_COMMUNITY)
Admission: EM | Admit: 2018-01-12 | Discharge: 2018-01-20 | DRG: 091 | Disposition: A | Discharge status: Hospice | Payer: Medicare Other | Source: Skilled Nursing Facility | Attending: Internal Medicine | Admitting: Internal Medicine

## 2018-01-12 DIAGNOSIS — R627 Adult failure to thrive: Secondary | ICD-10-CM | POA: Diagnosis present

## 2018-01-12 DIAGNOSIS — F17200 Nicotine dependence, unspecified, uncomplicated: Secondary | ICD-10-CM | POA: Diagnosis present

## 2018-01-12 DIAGNOSIS — I480 Paroxysmal atrial fibrillation: Secondary | ICD-10-CM | POA: Diagnosis present

## 2018-01-12 DIAGNOSIS — S8991XA Unspecified injury of right lower leg, initial encounter: Secondary | ICD-10-CM | POA: Diagnosis not present

## 2018-01-12 DIAGNOSIS — S065XAA Traumatic subdural hemorrhage with loss of consciousness status unknown, initial encounter: Secondary | ICD-10-CM

## 2018-01-12 DIAGNOSIS — S066X9A Traumatic subarachnoid hemorrhage with loss of consciousness of unspecified duration, initial encounter: Secondary | ICD-10-CM | POA: Diagnosis not present

## 2018-01-12 DIAGNOSIS — W19XXXA Unspecified fall, initial encounter: Secondary | ICD-10-CM

## 2018-01-12 DIAGNOSIS — S065X9A Traumatic subdural hemorrhage with loss of consciousness of unspecified duration, initial encounter: Secondary | ICD-10-CM | POA: Diagnosis not present

## 2018-01-12 DIAGNOSIS — R52 Pain, unspecified: Secondary | ICD-10-CM | POA: Diagnosis not present

## 2018-01-12 DIAGNOSIS — S0990XA Unspecified injury of head, initial encounter: Secondary | ICD-10-CM

## 2018-01-12 DIAGNOSIS — K219 Gastro-esophageal reflux disease without esophagitis: Secondary | ICD-10-CM | POA: Diagnosis present

## 2018-01-12 DIAGNOSIS — R2689 Other abnormalities of gait and mobility: Secondary | ICD-10-CM | POA: Diagnosis present

## 2018-01-12 DIAGNOSIS — S0219XA Other fracture of base of skull, initial encounter for closed fracture: Secondary | ICD-10-CM | POA: Diagnosis not present

## 2018-01-12 DIAGNOSIS — R296 Repeated falls: Secondary | ICD-10-CM | POA: Diagnosis not present

## 2018-01-12 DIAGNOSIS — I739 Peripheral vascular disease, unspecified: Secondary | ICD-10-CM | POA: Diagnosis present

## 2018-01-12 DIAGNOSIS — J690 Pneumonitis due to inhalation of food and vomit: Secondary | ICD-10-CM | POA: Diagnosis present

## 2018-01-12 DIAGNOSIS — L8990 Pressure ulcer of unspecified site, unspecified stage: Secondary | ICD-10-CM

## 2018-01-12 DIAGNOSIS — S0240EA Zygomatic fracture, right side, initial encounter for closed fracture: Secondary | ICD-10-CM | POA: Diagnosis not present

## 2018-01-12 DIAGNOSIS — S0240ED Zygomatic fracture, right side, subsequent encounter for fracture with routine healing: Secondary | ICD-10-CM

## 2018-01-12 DIAGNOSIS — Y92230 Patient room in hospital as the place of occurrence of the external cause: Secondary | ICD-10-CM | POA: Diagnosis not present

## 2018-01-12 DIAGNOSIS — W06XXXA Fall from bed, initial encounter: Secondary | ICD-10-CM | POA: Diagnosis not present

## 2018-01-12 DIAGNOSIS — Z23 Encounter for immunization: Secondary | ICD-10-CM | POA: Diagnosis not present

## 2018-01-12 DIAGNOSIS — S299XXA Unspecified injury of thorax, initial encounter: Secondary | ICD-10-CM | POA: Diagnosis not present

## 2018-01-12 DIAGNOSIS — F0391 Unspecified dementia with behavioral disturbance: Secondary | ICD-10-CM | POA: Diagnosis present

## 2018-01-12 DIAGNOSIS — Z66 Do not resuscitate: Secondary | ICD-10-CM | POA: Diagnosis not present

## 2018-01-12 DIAGNOSIS — S8002XA Contusion of left knee, initial encounter: Secondary | ICD-10-CM | POA: Diagnosis not present

## 2018-01-12 DIAGNOSIS — R531 Weakness: Secondary | ICD-10-CM | POA: Diagnosis not present

## 2018-01-12 DIAGNOSIS — E876 Hypokalemia: Secondary | ICD-10-CM | POA: Diagnosis present

## 2018-01-12 DIAGNOSIS — L8991 Pressure ulcer of unspecified site, stage 1: Secondary | ICD-10-CM | POA: Diagnosis present

## 2018-01-12 DIAGNOSIS — I4891 Unspecified atrial fibrillation: Secondary | ICD-10-CM

## 2018-01-12 DIAGNOSIS — Z79890 Hormone replacement therapy (postmenopausal): Secondary | ICD-10-CM

## 2018-01-12 DIAGNOSIS — S8992XA Unspecified injury of left lower leg, initial encounter: Secondary | ICD-10-CM | POA: Diagnosis not present

## 2018-01-12 DIAGNOSIS — R64 Cachexia: Secondary | ICD-10-CM | POA: Diagnosis present

## 2018-01-12 DIAGNOSIS — K4090 Unilateral inguinal hernia, without obstruction or gangrene, not specified as recurrent: Secondary | ICD-10-CM | POA: Diagnosis present

## 2018-01-12 DIAGNOSIS — Y92239 Unspecified place in hospital as the place of occurrence of the external cause: Secondary | ICD-10-CM | POA: Diagnosis not present

## 2018-01-12 DIAGNOSIS — T148XXA Other injury of unspecified body region, initial encounter: Secondary | ICD-10-CM

## 2018-01-12 DIAGNOSIS — F05 Delirium due to known physiological condition: Secondary | ICD-10-CM | POA: Diagnosis not present

## 2018-01-12 DIAGNOSIS — S02842A Fracture of lateral orbital wall, left side, initial encounter for closed fracture: Secondary | ICD-10-CM | POA: Diagnosis not present

## 2018-01-12 DIAGNOSIS — T796XXA Traumatic ischemia of muscle, initial encounter: Secondary | ICD-10-CM | POA: Diagnosis not present

## 2018-01-12 DIAGNOSIS — Z515 Encounter for palliative care: Secondary | ICD-10-CM | POA: Diagnosis not present

## 2018-01-12 DIAGNOSIS — S61411A Laceration without foreign body of right hand, initial encounter: Secondary | ICD-10-CM | POA: Diagnosis not present

## 2018-01-12 DIAGNOSIS — I471 Supraventricular tachycardia: Secondary | ICD-10-CM | POA: Diagnosis not present

## 2018-01-12 DIAGNOSIS — S0003XA Contusion of scalp, initial encounter: Secondary | ICD-10-CM | POA: Diagnosis not present

## 2018-01-12 DIAGNOSIS — S79912A Unspecified injury of left hip, initial encounter: Secondary | ICD-10-CM | POA: Diagnosis not present

## 2018-01-12 DIAGNOSIS — W07XXXA Fall from chair, initial encounter: Secondary | ICD-10-CM | POA: Diagnosis present

## 2018-01-12 DIAGNOSIS — S02609A Fracture of mandible, unspecified, initial encounter for closed fracture: Secondary | ICD-10-CM | POA: Diagnosis not present

## 2018-01-12 DIAGNOSIS — I609 Nontraumatic subarachnoid hemorrhage, unspecified: Secondary | ICD-10-CM

## 2018-01-12 DIAGNOSIS — E538 Deficiency of other specified B group vitamins: Secondary | ICD-10-CM | POA: Diagnosis present

## 2018-01-12 DIAGNOSIS — R55 Syncope and collapse: Secondary | ICD-10-CM | POA: Diagnosis not present

## 2018-01-12 DIAGNOSIS — S79911A Unspecified injury of right hip, initial encounter: Secondary | ICD-10-CM | POA: Diagnosis not present

## 2018-01-12 DIAGNOSIS — J449 Chronic obstructive pulmonary disease, unspecified: Secondary | ICD-10-CM | POA: Diagnosis present

## 2018-01-12 DIAGNOSIS — Z79891 Long term (current) use of opiate analgesic: Secondary | ICD-10-CM

## 2018-01-12 DIAGNOSIS — E860 Dehydration: Secondary | ICD-10-CM | POA: Diagnosis present

## 2018-01-12 DIAGNOSIS — F03918 Unspecified dementia, unspecified severity, with other behavioral disturbance: Secondary | ICD-10-CM

## 2018-01-12 DIAGNOSIS — Y95 Nosocomial condition: Secondary | ICD-10-CM | POA: Diagnosis present

## 2018-01-12 DIAGNOSIS — S0081XA Abrasion of other part of head, initial encounter: Secondary | ICD-10-CM | POA: Diagnosis present

## 2018-01-12 DIAGNOSIS — S0240DA Maxillary fracture, left side, initial encounter for closed fracture: Secondary | ICD-10-CM | POA: Diagnosis not present

## 2018-01-12 DIAGNOSIS — Z681 Body mass index (BMI) 19 or less, adult: Secondary | ICD-10-CM

## 2018-01-12 DIAGNOSIS — Y92009 Unspecified place in unspecified non-institutional (private) residence as the place of occurrence of the external cause: Secondary | ICD-10-CM

## 2018-01-12 DIAGNOSIS — S51811A Laceration without foreign body of right forearm, initial encounter: Secondary | ICD-10-CM | POA: Diagnosis present

## 2018-01-12 DIAGNOSIS — Z79899 Other long term (current) drug therapy: Secondary | ICD-10-CM

## 2018-01-12 DIAGNOSIS — S3993XA Unspecified injury of pelvis, initial encounter: Secondary | ICD-10-CM | POA: Diagnosis not present

## 2018-01-12 DIAGNOSIS — S20229A Contusion of unspecified back wall of thorax, initial encounter: Secondary | ICD-10-CM | POA: Diagnosis not present

## 2018-01-12 DIAGNOSIS — S8001XA Contusion of right knee, initial encounter: Secondary | ICD-10-CM | POA: Diagnosis not present

## 2018-01-12 DIAGNOSIS — G9341 Metabolic encephalopathy: Secondary | ICD-10-CM | POA: Diagnosis not present

## 2018-01-12 DIAGNOSIS — R0600 Dyspnea, unspecified: Secondary | ICD-10-CM

## 2018-01-12 LAB — URINALYSIS, ROUTINE W REFLEX MICROSCOPIC
Bilirubin Urine: NEGATIVE
GLUCOSE, UA: NEGATIVE mg/dL
Hgb urine dipstick: NEGATIVE
Ketones, ur: 20 mg/dL — AB
Leukocytes, UA: NEGATIVE
Nitrite: NEGATIVE
PROTEIN: NEGATIVE mg/dL
Specific Gravity, Urine: 1.025 (ref 1.005–1.030)
pH: 6 (ref 5.0–8.0)

## 2018-01-12 LAB — CBC WITH DIFFERENTIAL/PLATELET
ABS IMMATURE GRANULOCYTES: 0.01 10*3/uL (ref 0.00–0.07)
BASOS ABS: 0 10*3/uL (ref 0.0–0.1)
Basophils Relative: 0 %
EOS ABS: 0 10*3/uL (ref 0.0–0.5)
Eosinophils Relative: 1 %
HCT: 33.3 % — ABNORMAL LOW (ref 39.0–52.0)
Hemoglobin: 11 g/dL — ABNORMAL LOW (ref 13.0–17.0)
IMMATURE GRANULOCYTES: 0 %
Lymphocytes Relative: 10 %
Lymphs Abs: 0.7 10*3/uL (ref 0.7–4.0)
MCH: 30.6 pg (ref 26.0–34.0)
MCHC: 33 g/dL (ref 30.0–36.0)
MCV: 92.5 fL (ref 80.0–100.0)
MONOS PCT: 8 %
Monocytes Absolute: 0.5 10*3/uL (ref 0.1–1.0)
NEUTROS ABS: 5.4 10*3/uL (ref 1.7–7.7)
NEUTROS PCT: 81 %
PLATELETS: 298 10*3/uL (ref 150–400)
RBC: 3.6 MIL/uL — ABNORMAL LOW (ref 4.22–5.81)
RDW: 14.5 % (ref 11.5–15.5)
WBC: 6.6 10*3/uL (ref 4.0–10.5)
nRBC: 0 % (ref 0.0–0.2)

## 2018-01-12 LAB — BASIC METABOLIC PANEL
ANION GAP: 7 (ref 5–15)
BUN: 18 mg/dL (ref 8–23)
CALCIUM: 8.4 mg/dL — AB (ref 8.9–10.3)
CO2: 24 mmol/L (ref 22–32)
Chloride: 107 mmol/L (ref 98–111)
Creatinine, Ser: 0.98 mg/dL (ref 0.61–1.24)
GFR calc Af Amer: >60 mL/min (ref >60)
GFR calc non Af Amer: >60 mL/min (ref >60)
GLUCOSE: 90 mg/dL (ref 70–99)
Potassium: 3.4 mmol/L — ABNORMAL LOW (ref 3.5–5.1)
Sodium: 138 mmol/L (ref 135–145)

## 2018-01-12 MED ORDER — BACITRACIN-NEOMYCIN-POLYMYXIN 400-5-5000 EX OINT
TOPICAL_OINTMENT | Freq: Once | CUTANEOUS | Status: AC
  Administered 2018-01-12: 3 via TOPICAL
  Filled 2018-01-12: qty 2

## 2018-01-12 MED ORDER — ACETAMINOPHEN 650 MG RE SUPP
650.0000 mg | Freq: Four times a day (QID) | RECTAL | Status: DC | PRN
  Administered 2018-01-16: 650 mg via RECTAL
  Filled 2018-01-12: qty 1

## 2018-01-12 MED ORDER — ENOXAPARIN SODIUM 40 MG/0.4ML ~~LOC~~ SOLN
40.0000 mg | SUBCUTANEOUS | Status: DC
  Administered 2018-01-12 – 2018-01-13 (×2): 40 mg via SUBCUTANEOUS
  Filled 2018-01-12 (×2): qty 0.4

## 2018-01-12 MED ORDER — INFLUENZA VAC SPLIT HIGH-DOSE 0.5 ML IM SUSY
0.5000 mL | PREFILLED_SYRINGE | INTRAMUSCULAR | Status: AC
  Administered 2018-01-13: 0.5 mL via INTRAMUSCULAR
  Filled 2018-01-12: qty 0.5

## 2018-01-12 MED ORDER — ONDANSETRON HCL 4 MG PO TABS: 4.0000 mg | ORAL_TABLET | Freq: Four times a day (QID) | ORAL | Status: DC | PRN

## 2018-01-12 MED ORDER — ACETAMINOPHEN 325 MG PO TABS
650.0000 mg | ORAL_TABLET | Freq: Four times a day (QID) | ORAL | Status: DC | PRN
  Administered 2018-01-14: 650 mg via ORAL
  Filled 2018-01-12: qty 2

## 2018-01-12 MED ORDER — LORAZEPAM 0.5 MG PO TABS
0.5000 mg | ORAL_TABLET | Freq: Three times a day (TID) | ORAL | Status: DC | PRN
  Administered 2018-01-13: 0.5 mg via ORAL
  Filled 2018-01-12 (×2): qty 1

## 2018-01-12 MED ORDER — FOLIC ACID 1 MG PO TABS
1.0000 mg | ORAL_TABLET | Freq: Every day | ORAL | Status: DC
  Administered 2018-01-13 – 2018-01-16 (×4): 1 mg via ORAL
  Filled 2018-01-12 (×4): qty 1

## 2018-01-12 MED ORDER — ONDANSETRON HCL 4 MG/2ML IJ SOLN: 4.0000 mg | Freq: Four times a day (QID) | INTRAMUSCULAR | Status: DC | PRN

## 2018-01-12 MED ORDER — POTASSIUM CHLORIDE CRYS ER 20 MEQ PO TBCR
40.0000 meq | EXTENDED_RELEASE_TABLET | Freq: Once | ORAL | Status: AC
  Administered 2018-01-12: 40 meq via ORAL
  Filled 2018-01-12: qty 2

## 2018-01-12 MED ORDER — OLANZAPINE 5 MG PO TABS
5.0000 mg | ORAL_TABLET | Freq: Every day | ORAL | Status: DC
  Administered 2018-01-12 – 2018-01-13 (×2): 5 mg via ORAL
  Filled 2018-01-12 (×3): qty 1

## 2018-01-12 MED ORDER — PANTOPRAZOLE SODIUM 40 MG PO TBEC
40.0000 mg | DELAYED_RELEASE_TABLET | Freq: Every day | ORAL | Status: DC
  Administered 2018-01-13 – 2018-01-16 (×4): 40 mg via ORAL
  Filled 2018-01-12 (×4): qty 1

## 2018-01-12 MED ORDER — POTASSIUM CHLORIDE IN NACL 20-0.9 MEQ/L-% IV SOLN
INTRAVENOUS | Status: AC
  Administered 2018-01-12 – 2018-01-13 (×2): via INTRAVENOUS

## 2018-01-12 MED ORDER — BENZTROPINE MESYLATE 1 MG PO TABS
1.0000 mg | ORAL_TABLET | Freq: Every day | ORAL | Status: DC
  Administered 2018-01-13 – 2018-01-16 (×4): 1 mg via ORAL
  Filled 2018-01-12 (×4): qty 1

## 2018-01-12 NOTE — ED Provider Notes (Addendum)
[facility] EMERGENCY DEPARTMENT Provider Note   CSN: [id number] Arrival date & time: 01/12/18  0932     History   Chief Complaint Chief Complaint  Patient presents with  . Fall    HPI Thomas Foley is a 72 y.o. male with a history of COPD, GERD and dementia presenting from a local group home for evaluation of a fall.  The group home owner Ms. Rucker per phone call states that he spent a good part of last night walking and was very active and unable to sleep.  Early this morning he was lying in his bed, then a few minutes later he cried out for help and was found sitting on the floor beside the bed.  This presumed fall was unwitnessed.  Ms. Wyline Mood states that he has been unable to bear weight since his injury occurred, he was helped back into his bed.  He has sustained a skin tear on his right forearm, she states he has multiple other skin tears which never heal because he constantly picks at them.  He has had no treatment prior to arrival.  At baseline he is confused, mumbles and does require assistance with ADLs.  He is normally ambulatory, but she states he does have occasional falls.  The history is provided by the patient.    Past Medical History:  Diagnosis Date  . COPD (chronic obstructive pulmonary disease) (HCC)   . GERD (gastroesophageal reflux disease)     There are no active problems to display for this patient.   History reviewed. No pertinent surgical history.      Home Medications    Prior to Admission medications   Medication Sig Start Date End Date Taking? Authorizing Provider  acetaminophen (TYLENOL) 500 MG tablet Take 500 mg by mouth every 6 (six) hours as needed for moderate pain.    [provider]  benzonatate (TESSALON) 200 MG capsule Take 200 mg by mouth 3 (three) times daily as needed for cough.    [provider]  benztropine (COGENTIN) 1 MG tablet Take 1 tablet by mouth daily. 08/19/14   [provider]  Calcium  Carbonate-Vitamin D (CALCIUM 600+D) 600-400 MG-UNIT tablet Take 1 tablet by mouth daily.    [provider]  folic acid (FOLVITE) 1 MG tablet Take 1 mg by mouth daily.    [provider]  OLANZapine (ZYPREXA) 5 MG tablet Take 1 tablet by mouth at bedtime. 08/19/14   [provider]  omeprazole (PRILOSEC) 20 MG capsule Take 1 capsule by mouth daily. 08/19/14   [provider]  oxyCODONE-acetaminophen (PERCOCET/ROXICET) 5-325 MG tablet Take 1 tablet by mouth every 6 (six) hours as needed. 11/22/15   Bethann Berkshire, MD    Family History No family history on file.  Social History Social History   Tobacco Use  . Smoking status: Current Some Day Smoker  . Smokeless tobacco: Former Engineer, water Use Topics  . Alcohol use: No  . Drug use: No     Allergies   Patient has no known allergies.   Review of Systems Review of Systems  Unable to perform ROS: Dementia     Physical Exam Updated Vital Signs BP (!) 120/92   Pulse 67   Temp 98.1 F (36.7 C) (Oral)   Resp 14   SpO2 100%   Physical Exam  Constitutional: He appears well-developed and well-nourished. No distress.  HENT:  Head: Normocephalic and atraumatic.  No current findings suggesting head injury, he does have  an old healing scabbed abrasion on his forehead.  Eyes: Pupils are equal, round, and reactive to light. Conjunctivae are normal.  Patient does not follow commands for EOMs.  Neck: Normal range of motion. Neck supple.  Cardiovascular: Normal rate, regular rhythm, normal heart sounds and intact distal pulses.  Pulmonary/Chest: Effort normal and breath sounds normal. He has no wheezes.  Old appearing bruise left and right lateral thoracic spine.  There is no midline edema or evidence of trauma.  Abdominal: Soft. Bowel sounds are normal. He exhibits no distension. There is no tenderness. There is no guarding.  Musculoskeletal: Normal range of motion. He exhibits no tenderness or  deformity.    Patient does have bruises over his bilateral patella.  There is no effusion of either knee.  He is holding his legs with his knees in full flexion.   Neurological: He is alert.  Skin: Skin is warm and dry.  Psychiatric: He has a normal mood and affect.  Nursing note and vitals reviewed.    ED Treatments / Results  Labs (all labs ordered are listed, but only abnormal results are displayed) Labs Reviewed  BASIC METABOLIC PANEL - Abnormal; Notable for the following components:      Result Value   Potassium 3.4 (*)    Calcium 8.4 (*)    All other components within normal limits  CBC WITH DIFFERENTIAL/PLATELET - Abnormal; Notable for the following components:   RBC 3.60 (*)    Hemoglobin 11.0 (*)    HCT 33.3 (*)    All other components within normal limits  URINALYSIS, ROUTINE W REFLEX MICROSCOPIC - Abnormal; Notable for the following components:   Ketones, ur 20 (*)    All other components within normal limits    EKG None  Radiology Dg Chest 1 View  Result Date: 01/12/2018 CLINICAL DATA:  Fall from chair. EXAM: CHEST  1 VIEW COMPARISON:  Chest x-ray dated 08/12/2013. FINDINGS: Heart size and mediastinal contours are within normal limits. Lungs are clear. No pleural effusion or pneumothorax seen. Osseous structures about the chest are unremarkable. IMPRESSION: No acute findings.  Lungs are clear. Electronically Signed   By: Bary Richard M.D.   On: 01/12/2018 11:44   Ct Head Wo Contrast  Result Date: 01/12/2018 CLINICAL DATA:  Patient found face down on floor. Patient states he was looking for is pants. EXAM: CT HEAD WITHOUT CONTRAST CT CERVICAL SPINE WITHOUT CONTRAST TECHNIQUE: Multidetector CT imaging of the head and cervical spine was performed following the standard protocol without intravenous contrast. Multiplanar CT image reconstructions of the cervical spine were also generated. COMPARISON:  None. FINDINGS: CT HEAD FINDINGS Brain: No evidence of acute  infarction, hemorrhage, hydrocephalus, extra-axial collection or mass lesion/mass effect. There is mild diffuse low-attenuation within the subcortical and periventricular white matter compatible with chronic microvascular disease. Prominence of the sulci and ventricles compatible with brain atrophy. Vascular: No hyperdense vessel or unexpected calcification. Skull: Normal. Negative for fracture or focal lesion. Sinuses/Orbits: There is opacification of the right mastoid air cells. Mild mucosal thickening is noted within the left maxillary sinus. The remaining paranasal sinuses and the left mastoid air cells are clear. Other: Left posterior parietal scalp hematoma noted, image 56/5. CT CERVICAL SPINE FINDINGS Alignment: Normal. Skull base and vertebrae: No acute fracture. No primary bone lesion or focal pathologic process. Soft tissues and spinal canal: No prevertebral fluid or swelling. No visible canal hematoma. Disc levels: Ventral endplate spurring and disc space narrowing identified at C3-4 and C5-6. Upper  chest: A chronic appearing deformity involving the left shoulder including the distal clavicle and scapula identified. Other: None IMPRESSION: 1. No acute intracranial abnormalities. 2. Left posterior parietal scalp hematoma. 3. Chronic small vessel ischemic change and brain atrophy. 4. Right mastoid air cell opacification. 5. No evidence for cervical spine fracture 6. Cervical spine degenerative disc disease. Electronically Signed   By: Signa Kell M.D.   On: 01/12/2018 15:23   Ct Cervical Spine Wo Contrast  Result Date: 01/12/2018 CLINICAL DATA:  Patient found face down on floor. Patient states he was looking for is pants. EXAM: CT HEAD WITHOUT CONTRAST CT CERVICAL SPINE WITHOUT CONTRAST TECHNIQUE: Multidetector CT imaging of the head and cervical spine was performed following the standard protocol without intravenous contrast. Multiplanar CT image reconstructions of the cervical spine were also  generated. COMPARISON:  None. FINDINGS: CT HEAD FINDINGS Brain: No evidence of acute infarction, hemorrhage, hydrocephalus, extra-axial collection or mass lesion/mass effect. There is mild diffuse low-attenuation within the subcortical and periventricular white matter compatible with chronic microvascular disease. Prominence of the sulci and ventricles compatible with brain atrophy. Vascular: No hyperdense vessel or unexpected calcification. Skull: Normal. Negative for fracture or focal lesion. Sinuses/Orbits: There is opacification of the right mastoid air cells. Mild mucosal thickening is noted within the left maxillary sinus. The remaining paranasal sinuses and the left mastoid air cells are clear. Other: Left posterior parietal scalp hematoma noted, image 56/5. CT CERVICAL SPINE FINDINGS Alignment: Normal. Skull base and vertebrae: No acute fracture. No primary bone lesion or focal pathologic process. Soft tissues and spinal canal: No prevertebral fluid or swelling. No visible canal hematoma. Disc levels: Ventral endplate spurring and disc space narrowing identified at C3-4 and C5-6. Upper chest: A chronic appearing deformity involving the left shoulder including the distal clavicle and scapula identified. Other: None IMPRESSION: 1. No acute intracranial abnormalities. 2. Left posterior parietal scalp hematoma. 3. Chronic small vessel ischemic change and brain atrophy. 4. Right mastoid air cell opacification. 5. No evidence for cervical spine fracture 6. Cervical spine degenerative disc disease. Electronically Signed   By: Signa Kell M.D.   On: 01/12/2018 15:23   Ct Pelvis Wo Contrast  Result Date: 01/12/2018 CLINICAL DATA:  Fall today from chair.  Negative plain films. EXAM: CT PELVIS WITHOUT CONTRAST TECHNIQUE: Multidetector CT imaging of the pelvis was performed following the standard protocol without intravenous contrast. COMPARISON:  Pelvis/hip x-rays today. FINDINGS: Urinary Tract:  Within normal.  Bowel: Mild diverticulosis of the colon. Moderate segment of sigmoid colon extends into a left inguinal hernia down into the left scrotum. No obstruction. Vascular/Lymphatic: Calcified plaque over the abdominal aorta. No adenopathy. Reproductive:  Unremarkable. Other:  Left inguinal hernia as described above.  No free fluid. Musculoskeletal: No significant degenerative changes of the hips. No acute fracture or dislocation. Minimal degenerate change of the spine. IMPRESSION: No acute fracture. Left inguinal hernia containing a moderate segment of colon which extends down into the left Colonic diverticulosis. Aortic Atherosclerosis (ICD10-I70.0). Scrotum. Electronically Signed   By: Elberta Fortis M.D.   On: 01/12/2018 15:24   Dg Knee Complete 4 Views Left  Result Date: 01/12/2018 CLINICAL DATA:  Fall.  Slid out of a chair.  Initial encounter. EXAM: LEFT KNEE - COMPLETE 4+ VIEW COMPARISON:  None. FINDINGS: There is no evidence of acute fracture, dislocation, or knee joint effusion. No significant arthropathic changes are identified. Atherosclerotic vascular calcifications are noted. IMPRESSION: Negative. Electronically Signed   By: Sebastian Ache M.D.   On:  01/12/2018 11:38   Dg Knee Complete 4 Views Right  Result Date: 01/12/2018 CLINICAL DATA:  Fall from chair EXAM: RIGHT KNEE - COMPLETE 4+ VIEW COMPARISON:  None. FINDINGS: Osseous alignment is normal. No fracture line or displaced fracture fragment seen. No acute or suspicious osseous lesion. No appreciable joint effusion and adjacent soft tissues are unremarkable. IMPRESSION: Negative. Electronically Signed   By: Bary RichardStan  Maynard M.D.   On: 01/12/2018 11:43   Dg Hips Bilat W Or Wo Pelvis 2 Views  Result Date: 01/12/2018 CLINICAL DATA:  Fall from chair. EXAM: DG HIP (WITH OR WITHOUT PELVIS) 2V BILAT COMPARISON:  None. FINDINGS: Osseous alignment is normal. No fracture line or displaced fracture fragment seen. No degenerative change at either hip joint. Bowel  is noted within the scrotal sac, consistent with large hernia. Soft tissues about the pelvis and hips are otherwise unremarkable. IMPRESSION: 1. No osseous fracture or dislocation. 2. Herniated bowel within the scrotal sac. Electronically Signed   By: Bary RichardStan  Maynard M.D.   On: 01/12/2018 11:40    Procedures Procedures (including critical care time)  Medications Ordered in ED Medications  neomycin-bacitracin-polymyxin (NEOSPORIN) ointment (3 application Topical Given 01/12/18 1143)     Initial Impression / Assessment and Plan / ED Course  I have reviewed the triage vital signs and the nursing notes.  Pertinent labs & imaging results that were available during my care of the patient were reviewed by me and considered in my medical decision making (see chart for details).    During this ED visit patient actually fell for us.  He was in his bed with both rails up and his nurse went into check on him, he had taken his gown off and had crawled at the end of the bed and was on the floor on his belly.  We suspect that he fell as he had sustained a new contusion at his left forehead.  He was then sent for further imaging including CT scanning of his head and neck.  As his plain films were negative of his hips, also added CT hip and pelvis to rule out occult fracture given his difficulty with ambulation.  These were negative.  Patient with frequent falls with no plain x-ray or CT imaging suggesting intracranial injury or fractures.  Prior to discharge back to his care home, attempts at ambulation were difficult.  He was able to ambulate from the bed to the doorway, after which he became very unsteady and had to be helped back to his bed.  I discussed with Ms. Wyline MoodRucker the care homes administrator and she is very uncomfortable taking him back as she is concerned that he will fall and have a serious injury.  They cannot accommodate patients if they are unable to ambulate, wheelchair is not an option at his  current home.  She and I agree that pt needs higher level of care, possibly PT/conditioning, etc.  Will call for admission as pt currently cannot stand safely with recurrent falls.  Call from patient's DSS caseworker Ms. Antionette CharBailey Sivley, phone #[phone] extension [phone], stating she agrees with his need for full nursing home care.  She has placed a call to [facility] and there is a bed available for him at this facility, they will need an FL2 in order to accept him.  Final Clinical Impressions(s) / ED Diagnoses   Final diagnoses:  Fall, initial encounter  Abrasion  Injury of head, initial encounter    ED Discharge Orders  None       Burgess Amor, Cordelia Poche 01/12/18 1651    Burgess Amor, PA-C 01/12/18 1737   Ct imaging reviewed and noted. Left inguinal hernia with involvement of left scrotum.  Exam with chaperone present.  Pt had no pain, soft edema present.   Burgess Amor, PA-C 01/13/18 1610    Blane Ohara, MD 01/15/18 1901

## 2018-01-12 NOTE — ED Notes (Signed)
Neosporin applied to skin tears on right arm, telfa dressing applied and wrapped with kling wrap. Pt tolerated well.

## 2018-01-12 NOTE — H&P (Signed)
History and Physical    Baird Polinski ZOX:[id number] DOB: Feb 24, 1945 DOA: 01/12/2018  PCP: Avon Gully, MD   Patient coming from: [facility]  Chief Complaint: Multiple falls  HPI: Thomas Foley is a 72 y.o. male with medical history significant for dementia, COPD who was brought to the ED after an unwitnessed fall today.  Unable to obtain history from patient due to patient's significant dementia, is resident at a care home.  History is obtained from ED providers and chart review.  Patient spends a good part of last night walking and was very active and unable to sleep.  This morning he was found sitting on the floor beside the bed.  Patient was been unable to bear weight.  Notable skin tears on patient's upper extremities because patient constantly picks at them.  At baseline patient is ambulatory but confused and mumbles and requires assistance with ADLs.  ED Course:  Stable vitals.  Remarkable CBC BMP.  UA- not suggestive of infection.  Patient found on the floor on his belly in ED, despite guardrails.  Imaging included right and left x-rays, pelvic x-ray and CT to rule out occult fracture, portable chest x-ray all negative for acute abnormality.  Head and cervical CT-posterior parietal scalp hematoma, chronic small vessel ischemic change and brain atrophy. In the ED patient would walk 5-6 steps and legs would give way.  Hospitalist to admit for multiple falls, and inability to bear weight. Patient is a ward of the state.   Review of Systems: Able to obtain review of systems due to patient's baseline mental status.  Past Medical History:  Diagnosis Date  . COPD (chronic obstructive pulmonary disease) (HCC)   . GERD (gastroesophageal reflux disease)     History reviewed. No pertinent surgical history.   reports that he has been smoking. He has quit using smokeless tobacco. He reports that he does not drink alcohol or use drugs.  No Known Allergies  Unable to obtain family  history due to patient's baseline mental status.  Prior to Admission medications   Medication Sig Start Date End Date Taking? Authorizing Provider  acetaminophen (TYLENOL) 500 MG tablet Take 500 mg by mouth every 6 (six) hours as needed for moderate pain.    [provider]  benzonatate (TESSALON) 200 MG capsule Take 200 mg by mouth 3 (three) times daily as needed for cough.    [provider]  benztropine (COGENTIN) 1 MG tablet Take 1 tablet by mouth daily. 08/19/14   [provider]  Calcium Carbonate-Vitamin D (CALCIUM 600+D) 600-400 MG-UNIT tablet Take 1 tablet by mouth daily.    [provider]  folic acid (FOLVITE) 1 MG tablet Take 1 mg by mouth daily.    [provider]  OLANZapine (ZYPREXA) 5 MG tablet Take 1 tablet by mouth at bedtime. 08/19/14   [provider]  omeprazole (PRILOSEC) 20 MG capsule Take 1 capsule by mouth daily. 08/19/14   [provider]  oxyCODONE-acetaminophen (PERCOCET/ROXICET) 5-325 MG tablet Take 1 tablet by mouth every 6 (six) hours as needed. 11/22/15   Bethann Berkshire, MD    Physical Exam: Exam limited by patient's baseline mental status.  Patient mumbles in response to questions, speech appears slurred, and incomprehensible. Vitals:   01/12/18 1230 01/12/18 1330 01/12/18 1400 01/12/18 1719  BP: (!) 149/95 (!) 151/106 (!) 120/92 127/75  Pulse: (!) 47 78 67 72  Resp:    18  Temp:    98.1 F (36.7 C)  TempSrc:  Oral  SpO2: 100% 100% 100% 100%    Constitutional: Chronically ill-appearing, calm, comfortable, eating dinner Vitals:   01/12/18 1230 01/12/18 1330 01/12/18 1400 01/12/18 1719  BP: (!) 149/95 (!) 151/106 (!) 120/92 127/75  Pulse: (!) 47 78 67 72  Resp:    18  Temp:    98.1 F (36.7 C)  TempSrc:    Oral  SpO2: 100% 100% 100% 100%   Eyes: PERRL, lids and conjunctivae normal ENMT: Mucous membranes are moist- patient eating,  Unable to examine posterior pharynx Neck: normal, supple,  no masses, no thyromegaly Respiratory: clear to auscultation bilaterally, no wheezing, no crackles. Normal respiratory effort. No accessory muscle use.  Cardiovascular: Regular rate and rhythm, no murmurs / rubs / gallops. No extremity edema. 2+ pedal pulses. No carotid bruits.  Abdomen: no tenderness, no masses palpated. No hepatosplenomegaly. Bowel sounds positive.  Musculoskeletal: no clubbing / cyanosis. No joint deformity upper and lower extremities. Good ROM, no contractures. Normal muscle tone.  Skin: Appears dry, skin tears on upper extremities, bruising forehead and left knee Neurologic: Moving all extremities spontaneously. Psychiatric: Awake and alert unable to assess further  Labs on Admission: I have personally reviewed following labs and imaging studies  CBC: Recent Labs  Lab 01/12/18 1355  WBC 6.6  NEUTROABS 5.4  HGB 11.0*  HCT 33.3*  MCV 92.5  PLT 298   Basic Metabolic Panel: Recent Labs  Lab 01/12/18 1355  NA 138  K 3.4*  CL 107  CO2 24  GLUCOSE 90  BUN 18  CREATININE 0.98  CALCIUM 8.4*   Urine analysis:    Component Value Date/Time   COLORURINE YELLOW 01/12/2018 1327   APPEARANCEUR CLEAR 01/12/2018 1327   LABSPEC 1.025 01/12/2018 1327   PHURINE 6.0 01/12/2018 1327   GLUCOSEU NEGATIVE 01/12/2018 1327   HGBUR NEGATIVE 01/12/2018 1327   BILIRUBINUR NEGATIVE 01/12/2018 1327   KETONESUR 20 (A) 01/12/2018 1327   PROTEINUR NEGATIVE 01/12/2018 1327   NITRITE NEGATIVE 01/12/2018 1327   LEUKOCYTESUR NEGATIVE 01/12/2018 1327    Radiological Exams on Admission: Dg Chest 1 View  Result Date: 01/12/2018 CLINICAL DATA:  Fall from chair. EXAM: CHEST  1 VIEW COMPARISON:  Chest x-ray dated 08/12/2013. FINDINGS: Heart size and mediastinal contours are within normal limits. Lungs are clear. No pleural effusion or pneumothorax seen. Osseous structures about the chest are unremarkable. IMPRESSION: No acute findings.  Lungs are clear. Electronically Signed   By: Bary Richard M.D.   On: 01/12/2018 11:44   Ct Head Wo Contrast  Result Date: 01/12/2018 CLINICAL DATA:  Patient found face down on floor. Patient states he was looking for is pants. EXAM: CT HEAD WITHOUT CONTRAST CT CERVICAL SPINE WITHOUT CONTRAST TECHNIQUE: Multidetector CT imaging of the head and cervical spine was performed following the standard protocol without intravenous contrast. Multiplanar CT image reconstructions of the cervical spine were also generated. COMPARISON:  None. FINDINGS: CT HEAD FINDINGS Brain: No evidence of acute infarction, hemorrhage, hydrocephalus, extra-axial collection or mass lesion/mass effect. There is mild diffuse low-attenuation within the subcortical and periventricular white matter compatible with chronic microvascular disease. Prominence of the sulci and ventricles compatible with brain atrophy. Vascular: No hyperdense vessel or unexpected calcification. Skull: Normal. Negative for fracture or focal lesion. Sinuses/Orbits: There is opacification of the right mastoid air cells. Mild mucosal thickening is noted within the left maxillary sinus. The remaining paranasal sinuses and the left mastoid air cells are clear. Other: Left posterior parietal scalp hematoma noted, image 56/5. CT  CERVICAL SPINE FINDINGS Alignment: Normal. Skull base and vertebrae: No acute fracture. No primary bone lesion or focal pathologic process. Soft tissues and spinal canal: No prevertebral fluid or swelling. No visible canal hematoma. Disc levels: Ventral endplate spurring and disc space narrowing identified at C3-4 and C5-6. Upper chest: A chronic appearing deformity involving the left shoulder including the distal clavicle and scapula identified. Other: None IMPRESSION: 1. No acute intracranial abnormalities. 2. Left posterior parietal scalp hematoma. 3. Chronic small vessel ischemic change and brain atrophy. 4. Right mastoid air cell opacification. 5. No evidence for cervical spine fracture 6.  Cervical spine degenerative disc disease. Electronically Signed   By: Signa Kell M.D.   On: 01/12/2018 15:23   Ct Cervical Spine Wo Contrast  Result Date: 01/12/2018 CLINICAL DATA:  Patient found face down on floor. Patient states he was looking for is pants. EXAM: CT HEAD WITHOUT CONTRAST CT CERVICAL SPINE WITHOUT CONTRAST TECHNIQUE: Multidetector CT imaging of the head and cervical spine was performed following the standard protocol without intravenous contrast. Multiplanar CT image reconstructions of the cervical spine were also generated. COMPARISON:  None. FINDINGS: CT HEAD FINDINGS Brain: No evidence of acute infarction, hemorrhage, hydrocephalus, extra-axial collection or mass lesion/mass effect. There is mild diffuse low-attenuation within the subcortical and periventricular white matter compatible with chronic microvascular disease. Prominence of the sulci and ventricles compatible with brain atrophy. Vascular: No hyperdense vessel or unexpected calcification. Skull: Normal. Negative for fracture or focal lesion. Sinuses/Orbits: There is opacification of the right mastoid air cells. Mild mucosal thickening is noted within the left maxillary sinus. The remaining paranasal sinuses and the left mastoid air cells are clear. Other: Left posterior parietal scalp hematoma noted, image 56/5. CT CERVICAL SPINE FINDINGS Alignment: Normal. Skull base and vertebrae: No acute fracture. No primary bone lesion or focal pathologic process. Soft tissues and spinal canal: No prevertebral fluid or swelling. No visible canal hematoma. Disc levels: Ventral endplate spurring and disc space narrowing identified at C3-4 and C5-6. Upper chest: A chronic appearing deformity involving the left shoulder including the distal clavicle and scapula identified. Other: None IMPRESSION: 1. No acute intracranial abnormalities. 2. Left posterior parietal scalp hematoma. 3. Chronic small vessel ischemic change and brain atrophy. 4.  Right mastoid air cell opacification. 5. No evidence for cervical spine fracture 6. Cervical spine degenerative disc disease. Electronically Signed   By: Signa Kell M.D.   On: 01/12/2018 15:23   Ct Pelvis Wo Contrast  Result Date: 01/12/2018 CLINICAL DATA:  Fall today from chair.  Negative plain films. EXAM: CT PELVIS WITHOUT CONTRAST TECHNIQUE: Multidetector CT imaging of the pelvis was performed following the standard protocol without intravenous contrast. COMPARISON:  Pelvis/hip x-rays today. FINDINGS: Urinary Tract:  Within normal. Bowel: Mild diverticulosis of the colon. Moderate segment of sigmoid colon extends into a left inguinal hernia down into the left scrotum. No obstruction. Vascular/Lymphatic: Calcified plaque over the abdominal aorta. No adenopathy. Reproductive:  Unremarkable. Other:  Left inguinal hernia as described above.  No free fluid. Musculoskeletal: No significant degenerative changes of the hips. No acute fracture or dislocation. Minimal degenerate change of the spine. IMPRESSION: No acute fracture. Left inguinal hernia containing a moderate segment of colon which extends down into the left Colonic diverticulosis. Aortic Atherosclerosis (ICD10-I70.0). Scrotum. Electronically Signed   By: Elberta Fortis M.D.   On: 01/12/2018 15:24   Dg Knee Complete 4 Views Left  Result Date: 01/12/2018 CLINICAL DATA:  Fall.  Slid out of a chair.  Initial  encounter. EXAM: LEFT KNEE - COMPLETE 4+ VIEW COMPARISON:  None. FINDINGS: There is no evidence of acute fracture, dislocation, or knee joint effusion. No significant arthropathic changes are identified. Atherosclerotic vascular calcifications are noted. IMPRESSION: Negative. Electronically Signed   By: Sebastian AcheAllen  Grady M.D.   On: 01/12/2018 11:38   Dg Knee Complete 4 Views Right  Result Date: 01/12/2018 CLINICAL DATA:  Fall from chair EXAM: RIGHT KNEE - COMPLETE 4+ VIEW COMPARISON:  None. FINDINGS: Osseous alignment is normal. No fracture line or  displaced fracture fragment seen. No acute or suspicious osseous lesion. No appreciable joint effusion and adjacent soft tissues are unremarkable. IMPRESSION: Negative. Electronically Signed   By: Bary RichardStan  Maynard M.D.   On: 01/12/2018 11:43   Dg Hips Bilat W Or Wo Pelvis 2 Views  Result Date: 01/12/2018 CLINICAL DATA:  Fall from chair. EXAM: DG HIP (WITH OR WITHOUT PELVIS) 2V BILAT COMPARISON:  None. FINDINGS: Osseous alignment is normal. No fracture line or displaced fracture fragment seen. No degenerative change at either hip joint. Bowel is noted within the scrotal sac, consistent with large hernia. Soft tissues about the pelvis and hips are otherwise unremarkable. IMPRESSION: 1. No osseous fracture or dislocation. 2. Herniated bowel within the scrotal sac. Electronically Signed   By: Bary RichardStan  Maynard M.D.   On: 01/12/2018 11:40    EKG: None.  Assessment/Plan Active Problems:   Multiple falls   Pressure injury of skin   Multiple falls -likely secondary to advancing dementia.  No focal deficits. Head CT scalp hematoma, atrophy.  Pelvic x-ray and subsequent CT negative for occult follow-up fracture.  Knee x-rays no acute abnormality.  UA and chest x-ray no suggestive of infection.  Unremarkable CBC BMP.  -PT evaluation -Social work consult - Check serum B12, Folic acid, TSH - Check Mag - Appears dehydrated, N/s + 20 Kcl 100cc/hr x 15hrs -Fall precautions, sitter at bedside  Hypokalemia- 3.4 - BMP a.m - Replete K - Hydrate - Check Mag  Dementia-advanced.  Requires assistance with most ADLs.  Previously able to ambulate, now unable. -Continue PRN Ativan -Continue benztropine, olanzapine nightly. - Check serum B12, Folic acid, TSH - Swallow evaluation -    DVT prophylaxis: Lovenox Code Status:  Assume Full Family Communication: None at bedside Disposition Plan: 1-2 days Consults called: None Admission status: Obs, tele   Onnie BoerEjiroghene E Haddon Fyfe MD [facility] Pager [phone]  [phone] From 3PM- 11PM.  Otherwise please contact night-coverage [email or web address] Password [email or web address]  01/12/2018, 10:28 PM

## 2018-01-12 NOTE — ED Notes (Addendum)
Pt found face down in the floor. BP cuff, pulse ox, and gown removed by pt. Both side rails remained up on bed. Pt states he was looking for his pants. Pt assisted back to stretcher. Pt noted to have small skin tear on right hand approximately 1 cm and left upper forearm approximately 2 cm long, bruise noted above left eyebrow. Wound care preformed and non adhesive dressing applied. Sitter at bedside.

## 2018-01-12 NOTE — ED Triage Notes (Signed)
[facility] Pt here for fall. Facility states he slid out of a chair. Pt has multiple skin tears on upper extremities. Facility reports that pt has been picking at skin recently.

## 2018-01-12 NOTE — ED Notes (Signed)
Pt back from radiology 

## 2018-01-12 NOTE — ED Notes (Signed)
Pt dry before taken to floor.

## 2018-01-12 NOTE — ED Notes (Signed)
Burgess AmorJulie Idol PA at bedside and informed of fall.

## 2018-01-12 NOTE — Discharge Instructions (Signed)
There are no acute findings for fracture or other injury during todays evaluation.  He is able to ambulate short distances without assistance, but may benefit from additional assistance with walking to avoid falls and further injury.  Apply an antibiotic ointment to his skin tears twice daily followed by dressing to try to get him to avoid picking at these sites.

## 2018-01-13 DIAGNOSIS — F22 Delusional disorders: Secondary | ICD-10-CM

## 2018-01-13 DIAGNOSIS — R296 Repeated falls: Secondary | ICD-10-CM | POA: Diagnosis not present

## 2018-01-13 DIAGNOSIS — D519 Vitamin B12 deficiency anemia, unspecified: Secondary | ICD-10-CM

## 2018-01-13 DIAGNOSIS — E860 Dehydration: Secondary | ICD-10-CM | POA: Diagnosis not present

## 2018-01-13 DIAGNOSIS — F0390 Unspecified dementia without behavioral disturbance: Secondary | ICD-10-CM

## 2018-01-13 LAB — BASIC METABOLIC PANEL
Anion gap: 5 (ref 5–15)
BUN: 17 mg/dL (ref 8–23)
CO2: 24 mmol/L (ref 22–32)
Calcium: 7.9 mg/dL — ABNORMAL LOW (ref 8.9–10.3)
Chloride: 110 mmol/L (ref 98–111)
Creatinine, Ser: 1.08 mg/dL (ref 0.61–1.24)
GFR calc Af Amer: >60 mL/min (ref >60)
GFR calc non Af Amer: >60 mL/min (ref >60)
Glucose, Bld: 87 mg/dL (ref 70–99)
Potassium: 3.7 mmol/L (ref 3.5–5.1)
Sodium: 139 mmol/L (ref 135–145)

## 2018-01-13 LAB — MRSA PCR SCREENING: MRSA BY PCR: NEGATIVE

## 2018-01-13 LAB — TSH: TSH: 0.677 u[IU]/mL (ref 0.350–4.500)

## 2018-01-13 LAB — VITAMIN B12: Vitamin B-12: 149 pg/mL — ABNORMAL LOW (ref 180–914)

## 2018-01-13 LAB — MAGNESIUM: Magnesium: 1.9 mg/dL (ref 1.7–2.4)

## 2018-01-13 MED ORDER — CYANOCOBALAMIN 1000 MCG/ML IJ SOLN
1000.0000 ug | Freq: Once | INTRAMUSCULAR | Status: AC
  Administered 2018-01-13: 1000 ug via INTRAMUSCULAR
  Filled 2018-01-13: qty 1

## 2018-01-13 MED ORDER — HALOPERIDOL LACTATE 5 MG/ML IJ SOLN
1.0000 mg | Freq: Four times a day (QID) | INTRAMUSCULAR | Status: DC | PRN
  Administered 2018-01-13 – 2018-01-15 (×6): 1 mg via INTRAVENOUS
  Filled 2018-01-13 (×7): qty 1

## 2018-01-13 NOTE — Care Management Obs Status (Signed)
MEDICARE OBSERVATION STATUS NOTIFICATION   Patient Details  Name: Thomas Foley MRN: [id number] Date of Birth: 05/03/1945   Medicare Observation Status Notification Given:       Tacey RuizHunnicutt, Mikalia Fessel Diane, RN 01/13/2018, 7:41 AM

## 2018-01-13 NOTE — NC FL2 (Signed)
MEDICAID FL2 LEVEL OF CARE SCREENING TOOL     IDENTIFICATION  Patient Name: Thomas Foley Birthdate: 07/12/1945 Sex: male Admission Date (Current Location): 01/12/2018  [facility] [place]   Facility and Address:  [facility],  [address], [place] [id number]      Provider Number: [id number]  Attending Physician Name and Address:  Eddie Northhungel, Nishant, MD  Relative Name and Phone Number:       Current Level of Care: Hospital Recommended Level of Care: Skilled Nursing Facility Prior Approval Number:    Date Approved/Denied:   PASRR Number:    Discharge Plan: SNF    Current Diagnoses: Patient Active Problem List   Diagnosis Date Noted  . Multiple falls 01/12/2018  . Pressure injury of skin 01/12/2018    Orientation RESPIRATION BLADDER Height & Weight     Self  Normal Incontinent Weight: 60.5 kg Height:  6' (182.9 cm)  BEHAVIORAL SYMPTOMS/MOOD NEUROLOGICAL BOWEL NUTRITION STATUS      Continent Diet(Regular)  AMBULATORY STATUS COMMUNICATION OF NEEDS Skin   Extensive Assist   PU Stage and Appropriate Care(Sacrum-mid-no open skin)                       Personal Care Assistance Level of Assistance  Bathing, Feeding, Dressing Bathing Assistance: Maximum assistance Feeding assistance: Independent Dressing Assistance: Maximum assistance     Functional Limitations Info             SPECIAL CARE FACTORS FREQUENCY  (None)                    Contractures Contractures Info: Not present    Additional Factors Info  Code Status, Allergies Code Status Info: full Allergies Info: NKA           Current Medications (01/13/2018):  This is the current hospital active medication list Current Facility-Administered Medications  Medication Dose Route Frequency Provider Last Rate Last Dose  . acetaminophen (TYLENOL) tablet 650 mg  650 mg Oral Q6H PRN Emokpae, Ejiroghene E, MD       Or  . acetaminophen (TYLENOL)  suppository 650 mg  650 mg Rectal Q6H PRN Emokpae, Ejiroghene E, MD      . benztropine (COGENTIN) tablet 1 mg  1 mg Oral Daily Emokpae, Ejiroghene E, MD   1 mg at 01/13/18 1019  . enoxaparin (LOVENOX) injection 40 mg  40 mg Subcutaneous Q24H Emokpae, Ejiroghene E, MD   40 mg at 01/12/18 2303  . folic acid (FOLVITE) tablet 1 mg  1 mg Oral Daily Emokpae, Ejiroghene E, MD   1 mg at 01/13/18 1019  . LORazepam (ATIVAN) tablet 0.5 mg  0.5 mg Oral Q8H PRN Emokpae, Ejiroghene E, MD   0.5 mg at 01/13/18 0016  . OLANZapine (ZYPREXA) tablet 5 mg  5 mg Oral QHS Emokpae, Ejiroghene E, MD   5 mg at 01/12/18 2303  . ondansetron (ZOFRAN) tablet 4 mg  4 mg Oral Q6H PRN Emokpae, Ejiroghene E, MD       Or  . ondansetron (ZOFRAN) injection 4 mg  4 mg Intravenous Q6H PRN Emokpae, Ejiroghene E, MD      . pantoprazole (PROTONIX) EC tablet 40 mg  40 mg Oral Daily Emokpae, Ejiroghene E, MD   40 mg at 01/13/18 1019     Discharge Medications: Please see discharge summary for a list of discharge medications.  Relevant Imaging Results:  Relevant Lab Results:   Additional Information  Trish Mage, LCSW

## 2018-01-13 NOTE — Progress Notes (Signed)
Rehab Admissions Coordinator Note:  Patient was screened by Nanine MeansKelly Amando Chaput for appropriateness for an Inpatient Acute Rehab Consult.  At this time, we are recommending Skilled Nursing Facility. Pt does not have a diagnosis to warrant an inpatient rehab stay and appears to be more appropriate for SNF for post acute rehab venue. AC will sign off.    Nanine MeansKelly Lanita Stammen 01/13/2018, 2:31 PM  I can be reached at [phone].

## 2018-01-13 NOTE — Evaluation (Signed)
Physical Therapy Evaluation Patient Details Name: Thomas JarvisJames Foley MRN: [id number] DOB: 09/20/1945 Today's Date: 01/13/2018   History of Present Illness  Thomas Foley is a 72 y.o. male with medical history significant for dementia, COPD who was brought to the ED after an unwitnessed fall today.  Unable to obtain history from patient due to patient's significant dementia, is resident at a care home.  History is obtained from ED providers and chart review.  Patient spends a good part of last night walking and was very active and unable to sleep.  This morning he was found sitting on the floor beside the bed.  Patient was been unable to bear weight.  Notable skin tears on patient's upper extremities because patient constantly picks at them.  At baseline patient is ambulatory but confused and mumbles and requires assistance with ADLs.    Clinical Impression  Patient able to cooperate with therapy with Max verbal/tactile cueing, demonstrates slow labored cadence with frequent scissoring of legs, drifting to the right and balance decreases as patient gets more fatigued.  Patient tolerated sitting up in chair after therapy - RN/NT notified.  Patient will benefit from continued physical therapy in hospital and recommended venue below to increase strength, balance, endurance for safe ADLs and gait.    Follow Up Recommendations SNF;Supervision/Assistance - 24 hour;Supervision for mobility/OOB    Equipment Recommendations  None recommended by PT    Recommendations for Other Services       Precautions / Restrictions Precautions Precautions: Fall Restrictions Weight Bearing Restrictions: No      Mobility  Bed Mobility Overal bed mobility: Needs Assistance Bed Mobility: Supine to Sit;Sit to Supine     Supine to sit: Mod assist;Max assist Sit to supine: Mod assist   General bed mobility comments: requires max tactile/verbal cueing  Transfers Overall transfer level: Needs assistance Equipment  used: Rolling walker (2 wheeled) Transfers: Sit to/from [employer] CorporationStand;Stand Pivot Transfers Sit to Stand: Mod assist;Max assist Stand pivot transfers: Mod assist;Max assist       General transfer comment: Max tactile/verbal cueing  Ambulation/Gait Ambulation/Gait assistance: Mod assist;Max assist Gait Distance (Feet): 20 Feet Assistive device: Rolling walker (2 wheeled) Gait Pattern/deviations: Decreased step length - right;Decreased step length - left;Decreased stride length;Scissoring Gait velocity: slow   General Gait Details: slow labored cadence with frequent scissoring of legs with most difficulty making turns, drifts to the right  Stairs            Wheelchair Mobility    Modified Rankin (Stroke Patients Only)       Balance Overall balance assessment: Needs assistance Sitting-balance support: Feet supported;No upper extremity supported Sitting balance-Leahy Scale: Fair     Standing balance support: Bilateral upper extremity supported;During functional activity Standing balance-Leahy Scale: Poor Standing balance comment: fair/poor using RW                             Pertinent Vitals/Pain Pain Assessment: No/denies pain    Home Living Family/patient expects to be discharged to:: Assisted living                      Prior Function Level of Independence: Needs assistance   Gait / Transfers Assistance Needed: supervised household ambulation with RW  ADL's / Homemaking Assistance Needed: assisted by ALF staff        Hand Dominance        Extremity/Trunk Assessment   Upper Extremity Assessment Upper Extremity Assessment:  Generalized weakness    Lower Extremity Assessment Lower Extremity Assessment: Generalized weakness    Cervical / Trunk Assessment Cervical / Trunk Assessment: Normal  Communication   Communication: Expressive difficulties  Cognition Arousal/Alertness: Awake/alert Behavior During Therapy:  Restless;Impulsive;Flat affect Overall Cognitive Status: History of cognitive impairments - at baseline                                        General Comments      Exercises     Assessment/Plan    PT Assessment Patient needs continued PT services  PT Problem List Decreased strength;Decreased activity tolerance;Decreased balance;Decreased mobility       PT Treatment Interventions Gait training;Stair training;Functional mobility training;Therapeutic activities;Therapeutic exercise;Patient/family education    PT Goals (Current goals can be found in the Care Plan section)  Acute Rehab PT Goals Patient Stated Goal: not stated PT Goal Formulation: With patient Time For Goal Achievement: 01/27/18 Potential to Achieve Goals: Fair    Frequency Min 3X/week   Barriers to discharge        Co-evaluation               AM-PAC PT "6 Clicks" Mobility  Outcome Measure Help needed turning from your back to your side while in a flat bed without using bedrails?: Total Help needed moving from lying on your back to sitting on the side of a flat bed without using bedrails?: Total Help needed moving to and from a bed to a chair (including a wheelchair)?: Total Help needed standing up from a chair using your arms (e.g., wheelchair or bedside chair)?: A Lot Help needed to walk in hospital room?: A Lot Help needed climbing 3-5 steps with a railing? : Total 6 Click Score: 8    End of Session Equipment Utilized During Treatment: Gait belt Activity Tolerance: Patient tolerated treatment well;Patient limited by fatigue Patient left: in chair;with call bell/phone within reach;with chair alarm set Nurse Communication: Mobility status;Other (comment)(RN/NT notified that patient left up in chair) PT Visit Diagnosis: Unsteadiness on feet (R26.81);Other abnormalities of gait and mobility (R26.89);Muscle weakness (generalized) (M62.81)    Time: 1610-9604 PT Time Calculation  (min) (ACUTE ONLY): 27 min   Charges:   PT Evaluation $PT Eval Moderate Complexity: 1 Mod PT Treatments $Therapeutic Activity: 23-37 mins        12:13 PM, 01/13/18 Ocie Bob, MPT Physical Therapist with [facility] [phone] office [phone] mobile phone

## 2018-01-13 NOTE — Clinical Social Work Note (Signed)
Clinical Social Work Assessment  Patient Details  Name: Thomas Foley MRN: [id number] Date of Birth: 03/25/1945  Date of referral:  01/13/18               Reason for consult:  Facility Placement                Permission sought to share information with:  Guardian Permission granted to share information::  Yes, Verbal Permission Granted  Name::     Thomas Foley  Agency::  [place] [facility] [phone]  Relationship::     Contact Information:     Housing/Transportation Living arrangements for the past 2 months:  Assisted Living Facility Source of Information:  Guardian Patient Interpreter Needed:  None Criminal Activity/Legal Involvement Pertinent to Current Situation/Hospitalization:  No - Comment as needed Significant Relationships:  Merchandiser, retailCommunity Support, Other(Comment)([facility] Lives with:  Facility Resident Do you feel safe going back to the place where you live?    Need for family participation in patient care:  No (Coment)  Care giving concerns:  Guardian is hoping to get pt into higher level of care as [facility] like [facility] no [facility] for meeting his needs.   Social Worker assessment / plan:  Will work to get patient placed in SNF if possible.  Employment status:  Disabled (Comment on whether or not currently receiving Disability) Insurance information:  [place] PT Recommendations:  Skilled Nursing Facility Information / Referral to community resources:  Skilled Nursing Facility  Patient/Family's Response to care:  Pt not oriented x4.  Patient/Family's Understanding of and Emotional Response to Diagnosis, Current Treatment, and Prognosis:  Pt has limited understanding.  Guardian is hopeful for placement, even though patient is not yet approved for long term disability.  Emotional Assessment Appearance:  Appears older than stated age Attitude/Demeanor/Rapport:  Apprehensive, Irrational Affect (typically observed):   Afraid/Fearful, Restless, Apprehensive Orientation:    Alcohol / Substance use:  Not Applicable Psych involvement (Current and /or in the community):     Discharge Needs  Concerns to be addressed:  Adjustment to Illness, Discharge Planning Concerns Readmission within the last 30 days:  No Current discharge risk:  Cognitively Impaired Barriers to Discharge:  No SNF bed   Ida Rogueodney B Lakesia Dahle, LCSW 01/13/2018, 11:06 AM

## 2018-01-13 NOTE — [place] FL2 (Signed)
MEDICAID [id number] LEVEL OF CARE SCREENING TOOL     IDENTIFICATION  Patient Name: Thomas JarvisJames Stann Birthdate: 09/06/1945 Sex: male Admission Date (Current Location): 01/12/2018  [facility] [place]   Facility and Address:  [facility],  [address], [place] [id number]      Provider Number: [id number]  Attending Physician Name and Address:  Eddie Northhungel, Nishant, MD  Relative Name and Phone Number:       Current Level of Care: Hospital Recommended Level of Care: Skilled Nursing Facility Prior Approval Number:    Date Approved/Denied:   PASRR Number:    Discharge Plan: SNF    Current Diagnoses: Patient Active Problem List   Diagnosis Date Noted  . Multiple falls 01/12/2018  . Pressure injury of skin 01/12/2018    Orientation RESPIRATION BLADDER Height & Weight     Self  Normal Incontinent Weight: 60.5 kg Height:  6' (182.9 cm)  BEHAVIORAL SYMPTOMS/MOOD NEUROLOGICAL BOWEL NUTRITION STATUS      Continent Diet(Regular)  AMBULATORY STATUS COMMUNICATION OF NEEDS Skin   Extensive Assist   PU Stage and Appropriate Care(Sacrum-mid-no open skin)                       Personal Care Assistance Level of Assistance  Bathing, Feeding, Dressing Bathing Assistance: Maximum assistance Feeding assistance: Independent Dressing Assistance: Maximum assistance     Functional Limitations Info             SPECIAL CARE FACTORS FREQUENCY  (None)                    Contractures Contractures Info: Not present    Additional Factors Info  Code Status, Allergies Code Status Info: full Allergies Info: NKA           Current Medications (01/13/2018):  This is the current hospital active medication list Current Facility-Administered Medications  Medication Dose Route Frequency Provider Last Rate Last Dose  . acetaminophen (TYLENOL) tablet 650 mg  650 mg Oral Q6H PRN Emokpae, Ejiroghene E, MD       Or  . acetaminophen (TYLENOL)  suppository 650 mg  650 mg Rectal Q6H PRN Emokpae, Ejiroghene E, MD      . benztropine (COGENTIN) tablet 1 mg  1 mg Oral Daily Emokpae, Ejiroghene E, MD   1 mg at 01/13/18 1019  . enoxaparin (LOVENOX) injection 40 mg  40 mg Subcutaneous Q24H Emokpae, Ejiroghene E, MD   40 mg at 01/12/18 2303  . folic acid (FOLVITE) tablet 1 mg  1 mg Oral Daily Emokpae, Ejiroghene E, MD   1 mg at 01/13/18 1019  . haloperidol lactate (HALDOL) injection 1 mg  1 mg Intravenous Q6H PRN Dhungel, Nishant, MD   1 mg at 01/13/18 1540  . OLANZapine (ZYPREXA) tablet 5 mg  5 mg Oral QHS Emokpae, Ejiroghene E, MD   5 mg at 01/12/18 2303  . ondansetron (ZOFRAN) tablet 4 mg  4 mg Oral Q6H PRN Emokpae, Ejiroghene E, MD       Or  . ondansetron (ZOFRAN) injection 4 mg  4 mg Intravenous Q6H PRN Emokpae, Ejiroghene E, MD      . pantoprazole (PROTONIX) EC tablet 40 mg  40 mg Oral Daily Emokpae, Ejiroghene E, MD   40 mg at 01/13/18 1019     Discharge Medications: Please see discharge summary for a list of discharge medications.  Relevant Imaging Results:  Relevant Lab Results:   Additional Information  Trish Mage, LCSW

## 2018-01-13 NOTE — Plan of Care (Signed)
  Problem: Acute Rehab PT Goals(only PT should resolve) Goal: Pt Will Go Supine/Side To Sit Outcome: Progressing Flowsheets (Taken 01/13/2018 1214) Pt will go Supine/Side to Sit: with minimal assist Goal: Patient Will Transfer Sit To/From Stand Outcome: Progressing Flowsheets (Taken 01/13/2018 1214) Patient will transfer sit to/from stand: with minimal assist; with moderate assist Goal: Pt Will Transfer Bed To Chair/Chair To Bed Outcome: Progressing Flowsheets (Taken 01/13/2018 1214) Pt will Transfer Bed to Chair/Chair to Bed: with min assist; with mod assist Goal: Pt Will Ambulate Outcome: Progressing Flowsheets (Taken 01/13/2018 1214) Pt will Ambulate: with minimal assist; with moderate assist; 50 feet; with rolling walker   12:15 PM, 01/13/18 Ocie BobJames Junie Engram, MPT Physical Therapist [phone] office [phone] mobile phone

## 2018-01-13 NOTE — Progress Notes (Signed)
PROGRESS NOTE                                                                                                                                                                                                             Patient Demographics:    Thomas Foley, is a 72 y.o. male, DOB - 1945-03-19, UEA:[id number]  Admit date - 01/12/2018   Admitting Physician Ejiroghene Wendall Stade, MD  Outpatient Primary MD for the patient is Avon Gully, MD  LOS - 0  Outpatient Specialists:  Chief Complaint  Patient presents with  . Fall       Brief Narrative   72 year old male with history of severe dementia, COPD brought to the ED after unwitnessed fall.  He is a resident of a care home.  At baseline reportedly he is ambulatory but confused and requiring assistance with all his ADLs.  History very limited. In the ED vitals were stable.  Blood work was unremarkable.  He fell on the floor in the ED.  X-rays and head CT negative for any fracture, showed posterior scalp hematoma and chronic small ischemic vessel changes. Patient placed in observation for further management and physical therapy evaluation.   Subjective:   In bed, required mittens for trying to get out of bed, poorly comprehensive.  Assessment  & Plan :    Active Problems:   Multiple falls Due to progressive dementia and failure to thrive.  No fractures or acute findings on head CT. No signs of infection. TSH and folate normal.  Severely low B12 and given a dose of IM cyanocobalamin today. Also clinically dehydrated and getting IV fluids. Continue fall precautions and safety sitter at bedside. PT recommended SNF.  Social work consulted.  Hypokalemia Replenished.  Normal magnesium  Advanced dementia Resident of?  Group home and requires assistance with all his ADLs. On Cogentin and Zyprexa.      Code Status : Full code  Family Communication  : No family at  bedside.  Called Ms. Wyline Mood shows listed as contact and left a message.    Disposition Plan  : Needs SNF  Barriers For Discharge : Improving symptoms  Consults  : None  Procedures  : CT head  DVT Prophylaxis  :  Lovenox -   Lab Results  Component Value Date   PLT 298 01/12/2018  Antibiotics  :    Anti-infectives (From admission, onward)   None        Objective:   Vitals:   01/12/18 2030 01/12/18 2101 01/13/18 0537 01/13/18 1300  BP: 91/66 138/82 132/83   Pulse: 77 92 91   Resp:   19   Temp:  97.8 F (36.6 C) 98.5 F (36.9 C)   TempSrc:  Oral Oral   SpO2: 100% 97% 98%   Weight:  60.5 kg    Height:    6' (1.829 m)    Wt Readings from Last 3 Encounters:  01/12/18 60.5 kg  11/23/15 77.1 kg  11/22/15 77.1 kg     Intake/Output Summary (Last 24 hours) at 01/13/2018 1440 Last data filed at 01/13/2018 [id number] Gross per 24 hour  Intake 884.96 ml  Output -  Net 884.96 ml     Physical Exam  Gen: Elderly male, disheveled, fatigue HEENT: Dry mucosa, supple neck Chest: Clear bilaterally CVs: Normal S1-S2 GI: Soft, nondistended, nontender, bowel sounds present Musculoskeletal: Warm, no edema CNS: Arousable to command, not oriented, moves all extremities   Data Review:    CBC Recent Labs  Lab 01/12/18 1355  WBC 6.6  HGB 11.0*  HCT 33.3*  PLT 298  MCV 92.5  MCH 30.6  MCHC 33.0  RDW 14.5  LYMPHSABS 0.7  MONOABS 0.5  EOSABS 0.0  BASOSABS 0.0    Chemistries  Recent Labs  Lab 01/12/18 1355 01/13/18 0421  NA 138 139  K 3.4* 3.7  CL 107 110  CO2 24 24  GLUCOSE 90 87  BUN 18 17  CREATININE 0.98 1.08  CALCIUM 8.4* 7.9*  MG  --  1.9   ------------------------------------------------------------------------------------------------------------------ No results for input(s): CHOL, HDL, LDLCALC, TRIG, CHOLHDL, LDLDIRECT in the last 72 hours.  No results found for:  HGBA1C ------------------------------------------------------------------------------------------------------------------ Recent Labs    01/13/18 0421  TSH 0.677   ------------------------------------------------------------------------------------------------------------------ Recent Labs    01/13/18 0421  VITAMINB12 149*    Coagulation profile No results for input(s): INR, PROTIME in the last 168 hours.  No results for input(s): DDIMER in the last 72 hours.  Cardiac Enzymes No results for input(s): CKMB, TROPONINI, MYOGLOBIN in the last 168 hours.  Invalid input(s): CK ------------------------------------------------------------------------------------------------------------------ No results found for: BNP  Inpatient Medications  Scheduled Meds: . benztropine  1 mg Oral Daily  . enoxaparin (LOVENOX) injection  40 mg Subcutaneous Q24H  . folic acid  1 mg Oral Daily  . OLANZapine  5 mg Oral QHS  . pantoprazole  40 mg Oral Daily   Continuous Infusions: PRN Meds:.acetaminophen **OR** acetaminophen, LORazepam, ondansetron **OR** ondansetron (ZOFRAN) IV  Micro Results Recent Results (from the past 240 hour(s))  MRSA PCR Screening     Status: None   Collection Time: 01/12/18  9:16 PM  Result Value Ref Range Status   MRSA by PCR NEGATIVE NEGATIVE Final    Comment:        The GeneXpert MRSA Assay (FDA approved for NASAL specimens only), is one component of a comprehensive MRSA colonization surveillance program. It is not intended to diagnose MRSA infection nor to guide or monitor treatment for MRSA infections. Performed at [facility], [address] [place], [place] [address]     Radiology Reports Dg Chest 1 View  Result Date: 01/12/2018 CLINICAL DATA:  Fall from chair. EXAM: CHEST  1 VIEW COMPARISON:  Chest x-ray dated 08/12/2013. FINDINGS: Heart size and mediastinal contours are within normal limits. Lungs are clear. No pleural effusion  or pneumothorax seen.  Osseous structures about the chest are unremarkable. IMPRESSION: No acute findings.  Lungs are clear. Electronically Signed   By: Bary Richard M.D.   On: 01/12/2018 11:44   Ct Head Wo Contrast  Result Date: 01/12/2018 CLINICAL DATA:  Patient found face down on floor. Patient states he was looking for is pants. EXAM: CT HEAD WITHOUT CONTRAST CT CERVICAL SPINE WITHOUT CONTRAST TECHNIQUE: Multidetector CT imaging of the head and cervical spine was performed following the standard protocol without intravenous contrast. Multiplanar CT image reconstructions of the cervical spine were also generated. COMPARISON:  None. FINDINGS: CT HEAD FINDINGS Brain: No evidence of acute infarction, hemorrhage, hydrocephalus, extra-axial collection or mass lesion/mass effect. There is mild diffuse low-attenuation within the subcortical and periventricular white matter compatible with chronic microvascular disease. Prominence of the sulci and ventricles compatible with brain atrophy. Vascular: No hyperdense vessel or unexpected calcification. Skull: Normal. Negative for fracture or focal lesion. Sinuses/Orbits: There is opacification of the right mastoid air cells. Mild mucosal thickening is noted within the left maxillary sinus. The remaining paranasal sinuses and the left mastoid air cells are clear. Other: Left posterior parietal scalp hematoma noted, image 56/5. CT CERVICAL SPINE FINDINGS Alignment: Normal. Skull base and vertebrae: No acute fracture. No primary bone lesion or focal pathologic process. Soft tissues and spinal canal: No prevertebral fluid or swelling. No visible canal hematoma. Disc levels: Ventral endplate spurring and disc space narrowing identified at C3-4 and C5-6. Upper chest: A chronic appearing deformity involving the left shoulder including the distal clavicle and scapula identified. Other: None IMPRESSION: 1. No acute intracranial abnormalities. 2. Left posterior parietal scalp hematoma. 3. Chronic small  vessel ischemic change and brain atrophy. 4. Right mastoid air cell opacification. 5. No evidence for cervical spine fracture 6. Cervical spine degenerative disc disease. Electronically Signed   By: Signa Kell M.D.   On: 01/12/2018 15:23   Ct Cervical Spine Wo Contrast  Result Date: 01/12/2018 CLINICAL DATA:  Patient found face down on floor. Patient states he was looking for is pants. EXAM: CT HEAD WITHOUT CONTRAST CT CERVICAL SPINE WITHOUT CONTRAST TECHNIQUE: Multidetector CT imaging of the head and cervical spine was performed following the standard protocol without intravenous contrast. Multiplanar CT image reconstructions of the cervical spine were also generated. COMPARISON:  None. FINDINGS: CT HEAD FINDINGS Brain: No evidence of acute infarction, hemorrhage, hydrocephalus, extra-axial collection or mass lesion/mass effect. There is mild diffuse low-attenuation within the subcortical and periventricular white matter compatible with chronic microvascular disease. Prominence of the sulci and ventricles compatible with brain atrophy. Vascular: No hyperdense vessel or unexpected calcification. Skull: Normal. Negative for fracture or focal lesion. Sinuses/Orbits: There is opacification of the right mastoid air cells. Mild mucosal thickening is noted within the left maxillary sinus. The remaining paranasal sinuses and the left mastoid air cells are clear. Other: Left posterior parietal scalp hematoma noted, image 56/5. CT CERVICAL SPINE FINDINGS Alignment: Normal. Skull base and vertebrae: No acute fracture. No primary bone lesion or focal pathologic process. Soft tissues and spinal canal: No prevertebral fluid or swelling. No visible canal hematoma. Disc levels: Ventral endplate spurring and disc space narrowing identified at C3-4 and C5-6. Upper chest: A chronic appearing deformity involving the left shoulder including the distal clavicle and scapula identified. Other: None IMPRESSION: 1. No acute  intracranial abnormalities. 2. Left posterior parietal scalp hematoma. 3. Chronic small vessel ischemic change and brain atrophy. 4. Right mastoid air cell opacification. 5. No evidence for cervical  spine fracture 6. Cervical spine degenerative disc disease. Electronically Signed   By: Signa Kell M.D.   On: 01/12/2018 15:23   Ct Pelvis Wo Contrast  Result Date: 01/12/2018 CLINICAL DATA:  Fall today from chair.  Negative plain films. EXAM: CT PELVIS WITHOUT CONTRAST TECHNIQUE: Multidetector CT imaging of the pelvis was performed following the standard protocol without intravenous contrast. COMPARISON:  Pelvis/hip x-rays today. FINDINGS: Urinary Tract:  Within normal. Bowel: Mild diverticulosis of the colon. Moderate segment of sigmoid colon extends into a left inguinal hernia down into the left scrotum. No obstruction. Vascular/Lymphatic: Calcified plaque over the abdominal aorta. No adenopathy. Reproductive:  Unremarkable. Other:  Left inguinal hernia as described above.  No free fluid. Musculoskeletal: No significant degenerative changes of the hips. No acute fracture or dislocation. Minimal degenerate change of the spine. IMPRESSION: No acute fracture. Left inguinal hernia containing a moderate segment of colon which extends down into the left Colonic diverticulosis. Aortic Atherosclerosis (ICD10-I70.0). Scrotum. Electronically Signed   By: Elberta Fortis M.D.   On: 01/12/2018 15:24   Dg Knee Complete 4 Views Left  Result Date: 01/12/2018 CLINICAL DATA:  Fall.  Slid out of a chair.  Initial encounter. EXAM: LEFT KNEE - COMPLETE 4+ VIEW COMPARISON:  None. FINDINGS: There is no evidence of acute fracture, dislocation, or knee joint effusion. No significant arthropathic changes are identified. Atherosclerotic vascular calcifications are noted. IMPRESSION: Negative. Electronically Signed   By: Sebastian Ache M.D.   On: 01/12/2018 11:38   Dg Knee Complete 4 Views Right  Result Date: 01/12/2018 CLINICAL  DATA:  Fall from chair EXAM: RIGHT KNEE - COMPLETE 4+ VIEW COMPARISON:  None. FINDINGS: Osseous alignment is normal. No fracture line or displaced fracture fragment seen. No acute or suspicious osseous lesion. No appreciable joint effusion and adjacent soft tissues are unremarkable. IMPRESSION: Negative. Electronically Signed   By: Bary Richard M.D.   On: 01/12/2018 11:43   Dg Hips Bilat W Or Wo Pelvis 2 Views  Result Date: 01/12/2018 CLINICAL DATA:  Fall from chair. EXAM: DG HIP (WITH OR WITHOUT PELVIS) 2V BILAT COMPARISON:  None. FINDINGS: Osseous alignment is normal. No fracture line or displaced fracture fragment seen. No degenerative change at either hip joint. Bowel is noted within the scrotal sac, consistent with large hernia. Soft tissues about the pelvis and hips are otherwise unremarkable. IMPRESSION: 1. No osseous fracture or dislocation. 2. Herniated bowel within the scrotal sac. Electronically Signed   By: Bary Richard M.D.   On: 01/12/2018 11:40    Time Spent in minutes  25   Anastacio Bua M.D on 01/13/2018 at 2:40 PM  Between 7am to 7pm - Pager - [phone]  After 7pm go to [email or web address] - password [facility]  [facility] -  Office  [phone]

## 2018-01-14 ENCOUNTER — Observation Stay (HOSPITAL_COMMUNITY): Payer: Medicare Other

## 2018-01-14 DIAGNOSIS — H04129 Dry eye syndrome of unspecified lacrimal gland: Secondary | ICD-10-CM | POA: Diagnosis not present

## 2018-01-14 DIAGNOSIS — S0240DA Maxillary fracture, left side, initial encounter for closed fracture: Secondary | ICD-10-CM | POA: Diagnosis not present

## 2018-01-14 DIAGNOSIS — R509 Fever, unspecified: Secondary | ICD-10-CM | POA: Diagnosis not present

## 2018-01-14 DIAGNOSIS — R296 Repeated falls: Secondary | ICD-10-CM | POA: Diagnosis present

## 2018-01-14 DIAGNOSIS — Y95 Nosocomial condition: Secondary | ICD-10-CM | POA: Diagnosis not present

## 2018-01-14 DIAGNOSIS — I629 Nontraumatic intracranial hemorrhage, unspecified: Secondary | ICD-10-CM | POA: Diagnosis not present

## 2018-01-14 DIAGNOSIS — S0219XA Other fracture of base of skull, initial encounter for closed fracture: Secondary | ICD-10-CM | POA: Diagnosis not present

## 2018-01-14 DIAGNOSIS — G9341 Metabolic encephalopathy: Secondary | ICD-10-CM | POA: Diagnosis not present

## 2018-01-14 DIAGNOSIS — S02842A Fracture of lateral orbital wall, left side, initial encounter for closed fracture: Secondary | ICD-10-CM | POA: Diagnosis not present

## 2018-01-14 DIAGNOSIS — S0240ED Zygomatic fracture, right side, subsequent encounter for fracture with routine healing: Secondary | ICD-10-CM | POA: Diagnosis not present

## 2018-01-14 DIAGNOSIS — J449 Chronic obstructive pulmonary disease, unspecified: Secondary | ICD-10-CM | POA: Diagnosis present

## 2018-01-14 DIAGNOSIS — S199XXA Unspecified injury of neck, initial encounter: Secondary | ICD-10-CM | POA: Diagnosis not present

## 2018-01-14 DIAGNOSIS — S065X9A Traumatic subdural hemorrhage with loss of consciousness of unspecified duration, initial encounter: Secondary | ICD-10-CM | POA: Diagnosis not present

## 2018-01-14 DIAGNOSIS — W06XXXA Fall from bed, initial encounter: Secondary | ICD-10-CM | POA: Diagnosis not present

## 2018-01-14 DIAGNOSIS — K219 Gastro-esophageal reflux disease without esophagitis: Secondary | ICD-10-CM | POA: Diagnosis not present

## 2018-01-14 DIAGNOSIS — Z7189 Other specified counseling: Secondary | ICD-10-CM | POA: Diagnosis not present

## 2018-01-14 DIAGNOSIS — S0240EA Zygomatic fracture, right side, initial encounter for closed fracture: Secondary | ICD-10-CM | POA: Diagnosis not present

## 2018-01-14 DIAGNOSIS — R52 Pain, unspecified: Secondary | ICD-10-CM | POA: Diagnosis not present

## 2018-01-14 DIAGNOSIS — F419 Anxiety disorder, unspecified: Secondary | ICD-10-CM | POA: Diagnosis not present

## 2018-01-14 DIAGNOSIS — Y92239 Unspecified place in hospital as the place of occurrence of the external cause: Secondary | ICD-10-CM | POA: Diagnosis not present

## 2018-01-14 DIAGNOSIS — Z9181 History of falling: Secondary | ICD-10-CM | POA: Diagnosis not present

## 2018-01-14 DIAGNOSIS — Z681 Body mass index (BMI) 19 or less, adult: Secondary | ICD-10-CM | POA: Diagnosis not present

## 2018-01-14 DIAGNOSIS — Z23 Encounter for immunization: Secondary | ICD-10-CM | POA: Diagnosis present

## 2018-01-14 DIAGNOSIS — R2689 Other abnormalities of gait and mobility: Secondary | ICD-10-CM | POA: Diagnosis present

## 2018-01-14 DIAGNOSIS — Y92009 Unspecified place in unspecified non-institutional (private) residence as the place of occurrence of the external cause: Secondary | ICD-10-CM | POA: Diagnosis not present

## 2018-01-14 DIAGNOSIS — S0003XA Contusion of scalp, initial encounter: Secondary | ICD-10-CM | POA: Diagnosis not present

## 2018-01-14 DIAGNOSIS — I471 Supraventricular tachycardia: Secondary | ICD-10-CM | POA: Diagnosis not present

## 2018-01-14 DIAGNOSIS — W19XXXA Unspecified fall, initial encounter: Secondary | ICD-10-CM | POA: Diagnosis not present

## 2018-01-14 DIAGNOSIS — S02609A Fracture of mandible, unspecified, initial encounter for closed fracture: Secondary | ICD-10-CM | POA: Diagnosis not present

## 2018-01-14 DIAGNOSIS — Z515 Encounter for palliative care: Secondary | ICD-10-CM | POA: Diagnosis not present

## 2018-01-14 DIAGNOSIS — I4891 Unspecified atrial fibrillation: Secondary | ICD-10-CM | POA: Diagnosis not present

## 2018-01-14 DIAGNOSIS — W07XXXA Fall from chair, initial encounter: Secondary | ICD-10-CM | POA: Diagnosis not present

## 2018-01-14 DIAGNOSIS — Y92230 Patient room in hospital as the place of occurrence of the external cause: Secondary | ICD-10-CM | POA: Diagnosis not present

## 2018-01-14 DIAGNOSIS — R627 Adult failure to thrive: Secondary | ICD-10-CM | POA: Diagnosis present

## 2018-01-14 DIAGNOSIS — R64 Cachexia: Secondary | ICD-10-CM | POA: Diagnosis present

## 2018-01-14 DIAGNOSIS — F0391 Unspecified dementia with behavioral disturbance: Secondary | ICD-10-CM | POA: Diagnosis present

## 2018-01-14 DIAGNOSIS — R918 Other nonspecific abnormal finding of lung field: Secondary | ICD-10-CM | POA: Diagnosis not present

## 2018-01-14 DIAGNOSIS — E876 Hypokalemia: Secondary | ICD-10-CM | POA: Diagnosis present

## 2018-01-14 DIAGNOSIS — J690 Pneumonitis due to inhalation of food and vomit: Secondary | ICD-10-CM | POA: Diagnosis present

## 2018-01-14 DIAGNOSIS — R682 Dry mouth, unspecified: Secondary | ICD-10-CM | POA: Diagnosis not present

## 2018-01-14 DIAGNOSIS — F05 Delirium due to known physiological condition: Secondary | ICD-10-CM | POA: Diagnosis not present

## 2018-01-14 DIAGNOSIS — L8991 Pressure ulcer of unspecified site, stage 1: Secondary | ICD-10-CM | POA: Diagnosis present

## 2018-01-14 DIAGNOSIS — S065XAA Traumatic subdural hemorrhage with loss of consciousness status unknown, initial encounter: Secondary | ICD-10-CM

## 2018-01-14 DIAGNOSIS — E860 Dehydration: Secondary | ICD-10-CM | POA: Diagnosis present

## 2018-01-14 DIAGNOSIS — Z8679 Personal history of other diseases of the circulatory system: Secondary | ICD-10-CM | POA: Diagnosis not present

## 2018-01-14 DIAGNOSIS — R0602 Shortness of breath: Secondary | ICD-10-CM | POA: Diagnosis not present

## 2018-01-14 DIAGNOSIS — G311 Senile degeneration of brain, not elsewhere classified: Secondary | ICD-10-CM | POA: Diagnosis not present

## 2018-01-14 DIAGNOSIS — I609 Nontraumatic subarachnoid hemorrhage, unspecified: Secondary | ICD-10-CM | POA: Diagnosis not present

## 2018-01-14 DIAGNOSIS — M6281 Muscle weakness (generalized): Secondary | ICD-10-CM | POA: Diagnosis not present

## 2018-01-14 DIAGNOSIS — S066X9A Traumatic subarachnoid hemorrhage with loss of consciousness of unspecified duration, initial encounter: Secondary | ICD-10-CM | POA: Diagnosis not present

## 2018-01-14 DIAGNOSIS — S065X0A Traumatic subdural hemorrhage without loss of consciousness, initial encounter: Secondary | ICD-10-CM | POA: Diagnosis not present

## 2018-01-14 DIAGNOSIS — E538 Deficiency of other specified B group vitamins: Secondary | ICD-10-CM | POA: Diagnosis present

## 2018-01-14 LAB — FOLATE RBC
Folate, Hemolysate: 555.4 ng/mL
Folate, RBC: 1896 ng/mL (ref >498)
Hematocrit: 29.3 % — ABNORMAL LOW (ref 37.5–51.0)

## 2018-01-14 LAB — BASIC METABOLIC PANEL
Anion gap: 8 (ref 5–15)
BUN: 10 mg/dL (ref 8–23)
CO2: 24 mmol/L (ref 22–32)
Calcium: 8.3 mg/dL — ABNORMAL LOW (ref 8.9–10.3)
Chloride: 105 mmol/L (ref 98–111)
Creatinine, Ser: 1.04 mg/dL (ref 0.61–1.24)
GFR calc Af Amer: >60 mL/min (ref >60)
GFR calc non Af Amer: >60 mL/min (ref >60)
Glucose, Bld: 101 mg/dL — ABNORMAL HIGH (ref 70–99)
Potassium: 4.1 mmol/L (ref 3.5–5.1)
Sodium: 137 mmol/L (ref 135–145)

## 2018-01-14 LAB — MAGNESIUM: Magnesium: 1.9 mg/dL (ref 1.7–2.4)

## 2018-01-14 LAB — CK: Total CK: 893 U/L — ABNORMAL HIGH (ref 49–397)

## 2018-01-14 MED ORDER — CYANOCOBALAMIN 500 MCG PO TABS: 500.0000 ug | ORAL_TABLET | Freq: Every day | ORAL | 0 refills | Status: DC

## 2018-01-14 MED ORDER — ACETAMINOPHEN 500 MG PO TABS: 1000.0000 mg | ORAL_TABLET | Freq: Four times a day (QID) | ORAL | 0 refills | Status: AC | PRN

## 2018-01-14 MED ORDER — POTASSIUM CHLORIDE IN NACL 20-0.9 MEQ/L-% IV SOLN
INTRAVENOUS | Status: AC
  Administered 2018-01-14 – 2018-01-15 (×2): via INTRAVENOUS

## 2018-01-14 MED ORDER — CYANOCOBALAMIN 1000 MCG/ML IJ SOLN
1000.0000 ug | Freq: Every day | INTRAMUSCULAR | Status: DC
  Administered 2018-01-14 – 2018-01-18 (×5): 1000 ug via INTRAMUSCULAR
  Filled 2018-01-14 (×5): qty 1

## 2018-01-14 MED ORDER — HALOPERIDOL LACTATE 5 MG/ML IJ SOLN
5.0000 mg | Freq: Once | INTRAMUSCULAR | Status: AC
  Administered 2018-01-14: 5 mg via INTRAVENOUS

## 2018-01-14 NOTE — Progress Notes (Signed)
PMT consult received and chart reviewed. Patient awake but confused with baseline dementia. Safety sitter at bedside. No s/s of pain or discomfort. Voicemail left for Alisia FerrariBailey Sively, DSS guardian. Also attempted to contact [employer] but voicemail box is full. PMT provider will continue to follow and discuss GOC with guardian when available. Thank you.   NO CHARGE  Vennie HomansMegan Mairely Foxworth, FNP-C Palliative Medicine Team  Phone: [phone] Fax: [phone]

## 2018-01-14 NOTE — Progress Notes (Signed)
Physician Discharge Summary  Pilar JarvisJames Hersh XBJ:[id number] DOB: 05/08/1945 DOA: 01/12/2018  PCP: Avon GullyFanta, Tesfaye, MD  Admit date: 01/12/2018 Discharge date: 01/14/2018  Admitted From: [facility] Disposition:   SNF  Recommendations for Outpatient Follow-up:  1. Follow up with PCP in 1-2 weeks 2. Please obtain BMP/CBC in one week     Discharge Condition: Stable CODE STATUS: FULL Diet recommendation: dysphagia 3   Brief/Interim Summary: 72 year old male with history of severe dementia, COPD brought to the ED after unwitnessed fall.  He is a resident of a care home.  At baseline reportedly he is ambulatory but confused and requiring assistance with all his ADLs.  History very limited. In the ED vitals were stable.  Blood work was unremarkable.  He fell on the floor in the ED.  X-rays and head CT negative for any fracture, showed posterior scalp hematoma and chronic small ischemic vessel changes. Patient placed in observation for further management and physical therapy evaluation  Discharge Diagnoses:  Gait Instability/Frequent Falls -TSH 0.677 -Urinalysis negative for pyuria -Magnesium 1.9 -Serum B12 149 -RBC folate pending -CK   B12 deficiency -Supplement IM during hospitalization -Discharged with oral supplementation -Repeat serum B12 in 1 month  Hypokalemia -Repleted -Magnesium 1.9  Cognitive impairment/dementia -Continue olanzapine at bedtime and Cogentin -Haldol as needed agitation -Patient is at risk for hospital delirium -Patient has episodes of impulsiveness--> patient had mechanical fall a.m. 01/14/2018  Dehydration -pt received IVF during the hospitalization  Discharge Instructions   Allergies as of 01/14/2018   No Known Allergies     Medication List    TAKE these medications   acetaminophen 500 MG tablet Commonly known as:  TYLENOL Take 2 tablets (1,000 mg total) by mouth every 6 (six) hours as needed for mild pain or moderate pain. What changed:   when to take this   albuterol 108 (90 Base) MCG/ACT inhaler Commonly known as:  PROVENTIL HFA;VENTOLIN HFA Inhale 1-2 puffs into the lungs every 6 (six) hours as needed for wheezing or shortness of breath.   benztropine 1 MG tablet Commonly known as:  COGENTIN Take 1 tablet by mouth daily.   CALCIUM 600+D 600-400 MG-UNIT tablet Generic drug:  Calcium Carbonate-Vitamin D Take 1 tablet by mouth 3 (three) times daily with meals.   [employer] MELATONIN 3 MG Tabs Generic drug:  Melatonin Take 3 mg by mouth at bedtime.   folic acid 1 MG tablet Commonly known as:  FOLVITE Take 1 mg by mouth daily.   LORazepam 0.5 MG tablet Commonly known as:  ATIVAN Take 0.5 mg by mouth every 8 (eight) hours as needed for anxiety.   OLANZapine 5 MG tablet Commonly known as:  ZYPREXA Take 1 tablet by mouth at bedtime.   Omega-3 1000 MG Caps Take 1 capsule by mouth 2 (two) times daily.   omeprazole 20 MG capsule Commonly known as:  PRILOSEC Take 20 mg by mouth daily.   vitamin B-12 500 MCG tablet Commonly known as:  CYANOCOBALAMIN Take 1 tablet (500 mcg total) by mouth daily.      Follow-up Information    Schedule an appointment as soon as possible for a visit  with Avon GullyFanta, Tesfaye, MD.   Specialty:  Internal Medicine Why:  Call for a recheck appointment within 1 week. Contact information: [address] [place] [address] [phone]          No Known Allergies  Consultations:  none   Procedures/Studies: Dg Chest 1 View  Result Date: 01/12/2018 CLINICAL DATA:  Fall from chair. EXAM: CHEST  1 VIEW COMPARISON:  Chest x-ray dated 08/12/2013. FINDINGS: Heart size and mediastinal contours are within normal limits. Lungs are clear. No pleural effusion or pneumothorax seen. Osseous structures about the chest are unremarkable. IMPRESSION: No acute findings.  Lungs are clear. Electronically Signed   By: Bary Richard M.D.   On: 01/12/2018 11:44   Ct Head Wo Contrast  Result  Date: 01/12/2018 CLINICAL DATA:  Patient found face down on floor. Patient states he was looking for is pants. EXAM: CT HEAD WITHOUT CONTRAST CT CERVICAL SPINE WITHOUT CONTRAST TECHNIQUE: Multidetector CT imaging of the head and cervical spine was performed following the standard protocol without intravenous contrast. Multiplanar CT image reconstructions of the cervical spine were also generated. COMPARISON:  None. FINDINGS: CT HEAD FINDINGS Brain: No evidence of acute infarction, hemorrhage, hydrocephalus, extra-axial collection or mass lesion/mass effect. There is mild diffuse low-attenuation within the subcortical and periventricular white matter compatible with chronic microvascular disease. Prominence of the sulci and ventricles compatible with brain atrophy. Vascular: No hyperdense vessel or unexpected calcification. Skull: Normal. Negative for fracture or focal lesion. Sinuses/Orbits: There is opacification of the right mastoid air cells. Mild mucosal thickening is noted within the left maxillary sinus. The remaining paranasal sinuses and the left mastoid air cells are clear. Other: Left posterior parietal scalp hematoma noted, image 56/5. CT CERVICAL SPINE FINDINGS Alignment: Normal. Skull base and vertebrae: No acute fracture. No primary bone lesion or focal pathologic process. Soft tissues and spinal canal: No prevertebral fluid or swelling. No visible canal hematoma. Disc levels: Ventral endplate spurring and disc space narrowing identified at C3-4 and C5-6. Upper chest: A chronic appearing deformity involving the left shoulder including the distal clavicle and scapula identified. Other: None IMPRESSION: 1. No acute intracranial abnormalities. 2. Left posterior parietal scalp hematoma. 3. Chronic small vessel ischemic change and brain atrophy. 4. Right mastoid air cell opacification. 5. No evidence for cervical spine fracture 6. Cervical spine degenerative disc disease. Electronically Signed   By: Signa Kell M.D.   On: 01/12/2018 15:23   Ct Cervical Spine Wo Contrast  Result Date: 01/12/2018 CLINICAL DATA:  Patient found face down on floor. Patient states he was looking for is pants. EXAM: CT HEAD WITHOUT CONTRAST CT CERVICAL SPINE WITHOUT CONTRAST TECHNIQUE: Multidetector CT imaging of the head and cervical spine was performed following the standard protocol without intravenous contrast. Multiplanar CT image reconstructions of the cervical spine were also generated. COMPARISON:  None. FINDINGS: CT HEAD FINDINGS Brain: No evidence of acute infarction, hemorrhage, hydrocephalus, extra-axial collection or mass lesion/mass effect. There is mild diffuse low-attenuation within the subcortical and periventricular white matter compatible with chronic microvascular disease. Prominence of the sulci and ventricles compatible with brain atrophy. Vascular: No hyperdense vessel or unexpected calcification. Skull: Normal. Negative for fracture or focal lesion. Sinuses/Orbits: There is opacification of the right mastoid air cells. Mild mucosal thickening is noted within the left maxillary sinus. The remaining paranasal sinuses and the left mastoid air cells are clear. Other: Left posterior parietal scalp hematoma noted, image 56/5. CT CERVICAL SPINE FINDINGS Alignment: Normal. Skull base and vertebrae: No acute fracture. No primary bone lesion or focal pathologic process. Soft tissues and spinal canal: No prevertebral fluid or swelling. No visible canal hematoma. Disc levels: Ventral endplate spurring and disc space narrowing identified at C3-4 and C5-6. Upper chest: A chronic appearing deformity involving the left shoulder including the distal clavicle and scapula identified. Other: None IMPRESSION: 1. No  acute intracranial abnormalities. 2. Left posterior parietal scalp hematoma. 3. Chronic small vessel ischemic change and brain atrophy. 4. Right mastoid air cell opacification. 5. No evidence for cervical spine fracture  6. Cervical spine degenerative disc disease. Electronically Signed   By: Signa Kell M.D.   On: 01/12/2018 15:23   Ct Pelvis Wo Contrast  Result Date: 01/12/2018 CLINICAL DATA:  Fall today from chair.  Negative plain films. EXAM: CT PELVIS WITHOUT CONTRAST TECHNIQUE: Multidetector CT imaging of the pelvis was performed following the standard protocol without intravenous contrast. COMPARISON:  Pelvis/hip x-rays today. FINDINGS: Urinary Tract:  Within normal. Bowel: Mild diverticulosis of the colon. Moderate segment of sigmoid colon extends into a left inguinal hernia down into the left scrotum. No obstruction. Vascular/Lymphatic: Calcified plaque over the abdominal aorta. No adenopathy. Reproductive:  Unremarkable. Other:  Left inguinal hernia as described above.  No free fluid. Musculoskeletal: No significant degenerative changes of the hips. No acute fracture or dislocation. Minimal degenerate change of the spine. IMPRESSION: No acute fracture. Left inguinal hernia containing a moderate segment of colon which extends down into the left Colonic diverticulosis. Aortic Atherosclerosis (ICD10-I70.0). Scrotum. Electronically Signed   By: Elberta Fortis M.D.   On: 01/12/2018 15:24   Dg Knee Complete 4 Views Left  Result Date: 01/12/2018 CLINICAL DATA:  Fall.  Slid out of a chair.  Initial encounter. EXAM: LEFT KNEE - COMPLETE 4+ VIEW COMPARISON:  None. FINDINGS: There is no evidence of acute fracture, dislocation, or knee joint effusion. No significant arthropathic changes are identified. Atherosclerotic vascular calcifications are noted. IMPRESSION: Negative. Electronically Signed   By: Sebastian Ache M.D.   On: 01/12/2018 11:38   Dg Knee Complete 4 Views Right  Result Date: 01/12/2018 CLINICAL DATA:  Fall from chair EXAM: RIGHT KNEE - COMPLETE 4+ VIEW COMPARISON:  None. FINDINGS: Osseous alignment is normal. No fracture line or displaced fracture fragment seen. No acute or suspicious osseous lesion. No  appreciable joint effusion and adjacent soft tissues are unremarkable. IMPRESSION: Negative. Electronically Signed   By: Bary Richard M.D.   On: 01/12/2018 11:43   Dg Hips Bilat W Or Wo Pelvis 2 Views  Result Date: 01/12/2018 CLINICAL DATA:  Fall from chair. EXAM: DG HIP (WITH OR WITHOUT PELVIS) 2V BILAT COMPARISON:  None. FINDINGS: Osseous alignment is normal. No fracture line or displaced fracture fragment seen. No degenerative change at either hip joint. Bowel is noted within the scrotal sac, consistent with large hernia. Soft tissues about the pelvis and hips are otherwise unremarkable. IMPRESSION: 1. No osseous fracture or dislocation. 2. Herniated bowel within the scrotal sac. Electronically Signed   By: Bary Richard M.D.   On: 01/12/2018 11:40        Discharge Exam: Vitals:   01/14/18 1046 01/14/18 1100  BP: (!) 174/147 (!) 126/105  Pulse:  (!) 112  Resp:    Temp:    SpO2: 99% 99%   Vitals:   01/13/18 2119 01/14/18 0609 01/14/18 1046 01/14/18 1100  BP: 128/86 (!) 92/59 (!) 174/147 (!) 126/105  Pulse: 85 84  (!) 112  Resp:  18    Temp: 98.1 F (36.7 C) 98.4 F (36.9 C)    TempSrc: Oral Oral    SpO2:  96% 99% 99%  Weight:      Height:        General: Pt is alert, awake, not in acute distress Cardiovascular: RRR, S1/S2 +, no rubs, no gallops Respiratory: CTA bilaterally, no wheezing, no rhonchi Abdominal:  Soft, NT, ND, bowel sounds + Extremities: no edema, no cyanosis   The results of significant diagnostics from this hospitalization (including imaging, microbiology, ancillary and laboratory) are listed below for reference.    Significant Diagnostic Studies: Dg Chest 1 View  Result Date: 01/12/2018 CLINICAL DATA:  Fall from chair. EXAM: CHEST  1 VIEW COMPARISON:  Chest x-ray dated 08/12/2013. FINDINGS: Heart size and mediastinal contours are within normal limits. Lungs are clear. No pleural effusion or pneumothorax seen. Osseous structures about the chest are  unremarkable. IMPRESSION: No acute findings.  Lungs are clear. Electronically Signed   By: Bary Richard M.D.   On: 01/12/2018 11:44   Ct Head Wo Contrast  Result Date: 01/12/2018 CLINICAL DATA:  Patient found face down on floor. Patient states he was looking for is pants. EXAM: CT HEAD WITHOUT CONTRAST CT CERVICAL SPINE WITHOUT CONTRAST TECHNIQUE: Multidetector CT imaging of the head and cervical spine was performed following the standard protocol without intravenous contrast. Multiplanar CT image reconstructions of the cervical spine were also generated. COMPARISON:  None. FINDINGS: CT HEAD FINDINGS Brain: No evidence of acute infarction, hemorrhage, hydrocephalus, extra-axial collection or mass lesion/mass effect. There is mild diffuse low-attenuation within the subcortical and periventricular white matter compatible with chronic microvascular disease. Prominence of the sulci and ventricles compatible with brain atrophy. Vascular: No hyperdense vessel or unexpected calcification. Skull: Normal. Negative for fracture or focal lesion. Sinuses/Orbits: There is opacification of the right mastoid air cells. Mild mucosal thickening is noted within the left maxillary sinus. The remaining paranasal sinuses and the left mastoid air cells are clear. Other: Left posterior parietal scalp hematoma noted, image 56/5. CT CERVICAL SPINE FINDINGS Alignment: Normal. Skull base and vertebrae: No acute fracture. No primary bone lesion or focal pathologic process. Soft tissues and spinal canal: No prevertebral fluid or swelling. No visible canal hematoma. Disc levels: Ventral endplate spurring and disc space narrowing identified at C3-4 and C5-6. Upper chest: A chronic appearing deformity involving the left shoulder including the distal clavicle and scapula identified. Other: None IMPRESSION: 1. No acute intracranial abnormalities. 2. Left posterior parietal scalp hematoma. 3. Chronic small vessel ischemic change and brain  atrophy. 4. Right mastoid air cell opacification. 5. No evidence for cervical spine fracture 6. Cervical spine degenerative disc disease. Electronically Signed   By: Signa Kell M.D.   On: 01/12/2018 15:23   Ct Cervical Spine Wo Contrast  Result Date: 01/12/2018 CLINICAL DATA:  Patient found face down on floor. Patient states he was looking for is pants. EXAM: CT HEAD WITHOUT CONTRAST CT CERVICAL SPINE WITHOUT CONTRAST TECHNIQUE: Multidetector CT imaging of the head and cervical spine was performed following the standard protocol without intravenous contrast. Multiplanar CT image reconstructions of the cervical spine were also generated. COMPARISON:  None. FINDINGS: CT HEAD FINDINGS Brain: No evidence of acute infarction, hemorrhage, hydrocephalus, extra-axial collection or mass lesion/mass effect. There is mild diffuse low-attenuation within the subcortical and periventricular white matter compatible with chronic microvascular disease. Prominence of the sulci and ventricles compatible with brain atrophy. Vascular: No hyperdense vessel or unexpected calcification. Skull: Normal. Negative for fracture or focal lesion. Sinuses/Orbits: There is opacification of the right mastoid air cells. Mild mucosal thickening is noted within the left maxillary sinus. The remaining paranasal sinuses and the left mastoid air cells are clear. Other: Left posterior parietal scalp hematoma noted, image 56/5. CT CERVICAL SPINE FINDINGS Alignment: Normal. Skull base and vertebrae: No acute fracture. No primary bone lesion or focal pathologic process. Soft  tissues and spinal canal: No prevertebral fluid or swelling. No visible canal hematoma. Disc levels: Ventral endplate spurring and disc space narrowing identified at C3-4 and C5-6. Upper chest: A chronic appearing deformity involving the left shoulder including the distal clavicle and scapula identified. Other: None IMPRESSION: 1. No acute intracranial abnormalities. 2. Left  posterior parietal scalp hematoma. 3. Chronic small vessel ischemic change and brain atrophy. 4. Right mastoid air cell opacification. 5. No evidence for cervical spine fracture 6. Cervical spine degenerative disc disease. Electronically Signed   By: Signa Kell M.D.   On: 01/12/2018 15:23   Ct Pelvis Wo Contrast  Result Date: 01/12/2018 CLINICAL DATA:  Fall today from chair.  Negative plain films. EXAM: CT PELVIS WITHOUT CONTRAST TECHNIQUE: Multidetector CT imaging of the pelvis was performed following the standard protocol without intravenous contrast. COMPARISON:  Pelvis/hip x-rays today. FINDINGS: Urinary Tract:  Within normal. Bowel: Mild diverticulosis of the colon. Moderate segment of sigmoid colon extends into a left inguinal hernia down into the left scrotum. No obstruction. Vascular/Lymphatic: Calcified plaque over the abdominal aorta. No adenopathy. Reproductive:  Unremarkable. Other:  Left inguinal hernia as described above.  No free fluid. Musculoskeletal: No significant degenerative changes of the hips. No acute fracture or dislocation. Minimal degenerate change of the spine. IMPRESSION: No acute fracture. Left inguinal hernia containing a moderate segment of colon which extends down into the left Colonic diverticulosis. Aortic Atherosclerosis (ICD10-I70.0). Scrotum. Electronically Signed   By: Elberta Fortis M.D.   On: 01/12/2018 15:24   Dg Knee Complete 4 Views Left  Result Date: 01/12/2018 CLINICAL DATA:  Fall.  Slid out of a chair.  Initial encounter. EXAM: LEFT KNEE - COMPLETE 4+ VIEW COMPARISON:  None. FINDINGS: There is no evidence of acute fracture, dislocation, or knee joint effusion. No significant arthropathic changes are identified. Atherosclerotic vascular calcifications are noted. IMPRESSION: Negative. Electronically Signed   By: Sebastian Ache M.D.   On: 01/12/2018 11:38   Dg Knee Complete 4 Views Right  Result Date: 01/12/2018 CLINICAL DATA:  Fall from chair EXAM: RIGHT KNEE  - COMPLETE 4+ VIEW COMPARISON:  None. FINDINGS: Osseous alignment is normal. No fracture line or displaced fracture fragment seen. No acute or suspicious osseous lesion. No appreciable joint effusion and adjacent soft tissues are unremarkable. IMPRESSION: Negative. Electronically Signed   By: Bary Richard M.D.   On: 01/12/2018 11:43   Dg Hips Bilat W Or Wo Pelvis 2 Views  Result Date: 01/12/2018 CLINICAL DATA:  Fall from chair. EXAM: DG HIP (WITH OR WITHOUT PELVIS) 2V BILAT COMPARISON:  None. FINDINGS: Osseous alignment is normal. No fracture line or displaced fracture fragment seen. No degenerative change at either hip joint. Bowel is noted within the scrotal sac, consistent with large hernia. Soft tissues about the pelvis and hips are otherwise unremarkable. IMPRESSION: 1. No osseous fracture or dislocation. 2. Herniated bowel within the scrotal sac. Electronically Signed   By: Bary Richard M.D.   On: 01/12/2018 11:40     Microbiology: Recent Results (from the past 240 hour(s))  MRSA PCR Screening     Status: None   Collection Time: 01/12/18  9:16 PM  Result Value Ref Range Status   MRSA by PCR NEGATIVE NEGATIVE Final    Comment:        The GeneXpert MRSA Assay (FDA approved for NASAL specimens only), is one component of a comprehensive MRSA colonization surveillance program. It is not intended to diagnose MRSA infection nor to guide or monitor treatment  for MRSA infections. Performed at [facility], [address] [place], [place] [address]      Labs: Basic Metabolic Panel: Recent Labs  Lab 01/12/18 1355 01/13/18 0421  NA 138 139  K 3.4* 3.7  CL 107 110  CO2 24 24  GLUCOSE 90 87  BUN 18 17  CREATININE 0.98 1.08  CALCIUM 8.4* 7.9*  MG  --  1.9   Liver Function Tests: No results for input(s): AST, ALT, ALKPHOS, BILITOT, PROT, ALBUMIN in the last 168 hours. No results for input(s): LIPASE, AMYLASE in the last 168 hours. No results for input(s): AMMONIA in the last  168 hours. CBC: Recent Labs  Lab 01/12/18 1355  WBC 6.6  NEUTROABS 5.4  HGB 11.0*  HCT 33.3*  MCV 92.5  PLT 298   Cardiac Enzymes: No results for input(s): CKTOTAL, CKMB, CKMBINDEX, TROPONINI in the last 168 hours. BNP: Invalid input(s): POCBNP CBG: No results for input(s): GLUCAP in the last 168 hours.  Time coordinating discharge:  36 minutes  Signed:  Catarina Hartshorn, DO [facility] Pager: [phone] 01/14/2018, 11:34 AM

## 2018-01-14 NOTE — Progress Notes (Signed)
Pt was found on the floor by [place], NT. Pt had all fall risk protocols in order except somehow the bed alarm failed to be turned back on. Pt was sharing a Recruitment consultantsafety sitter between another room. Vitals obtained, WNL besides blood pressure slightly elevated but patient was moving a lot while trying to obtain them. Pt did hit face/ head area, so a head CT is ordered. Pt neurological status remains the same. Pt did obtain a skin tear to his right forehead area. Ice applied. Will continue to monitor.  MD made aware

## 2018-01-14 NOTE — Progress Notes (Signed)
PROGRESS NOTE  Thomas Foley ZOX:[id number] DOB: [birth year] DOA: 01/12/2018 PCP: Avon Gully, MD  Brief History:  72 year old male with history of severe dementia, COPD brought to the ED after unwitnessed fall. He is a resident of a care home. At baseline reportedly he is ambulatory but confused and requiring assistance with all his ADLs. History very limited. In the ED vitals were stable. Blood work was unremarkable. He fell on the floor in the ED. X-rays and head CT negative for any fracture, showed posterior scalp hematoma and chronic small ischemic vessel changes. Patient placed in observation for further management and physical therapy evaluation. During his hospitalization, the patient had intermittent episodes of hospital delirium with agitation.  He required Haldol and Ativan.  Unfortunately, the patient sustained a mechanical fall on the morning of 01/14/2018.  CT of the head showed new small subdural hemorrhage measuring 6 mm in the right temporal lobe.  There is a small amount of traumatic subarachnoid hemorrhage in the adjacent sulci.  There is a trace subdural hemorrhage in the lateral temporal lobe on the left.  There is also a nondisplaced mandibular fracture on the right frontal temporal bone as well as the right zygomatic arch.  There was a nondisplaced fracture of the lateral wall of the left orbit and left maxillary sinus.  The case was discussed with neurosurgery and ENT trauma.  They both recommended conservative, nonoperative management.  Assessment/Plan: Subdural hematoma/subarachnoid hemorrhage -01/14/2018 CT brain--subdural hemorrhage and subarachnoid hemorrhage as discussed above -Case was discussed with neurosurgery and ENT trauma--> recommend nonoperative, conservative management -Discontinue enoxaparin -Place SCDs  Facial fractures/maxillary sinus fracture -Secondary to mechanical fall -Case discussed with ENT trauma--> conservative management,  nonoperative management  Gait Instability/Frequent Falls -TSH 0.677 -Urinalysis negative for pyuria -Magnesium 1.9 -Serum B12 149 -RBC folate pending -CK--893  Mild rhabdomyolysis-traumatic -CPK 93 -Start IV fluids -Repeat CPK in the morning  B12 deficiency -Supplement IM during hospitalization -Discharged with oral supplementation -Repeat serum B12 in 1 month  Hypokalemia -Repleted -Magnesium 1.9  Cognitive impairment/dementia -Continue olanzapine at bedtime and Cogentin -Haldol as needed agitation -Patient is at risk for hospital delirium -Patient has episodes of impulsiveness--> patient had mechanical fall a.m. 01/14/2018  Dehydration -pt received IVF during the hospitalization  Stage I pressure injury--sacrum -Local wound care -Not infected on exam -Present prior to admission    Disposition Plan:   SNF 1-2 days  Family Communication:  No Family at bedside  Consultants:  none  Code Status:  FULL   DVT Prophylaxis:  SCDs   Procedures: As Listed in Progress Note Above  Antibiotics: None      Subjective: Patient is awake and alert.  He is pleasantly confused.  He mumbles his name.  Most the time he is incomprehensible.  No reports of vomiting, uncontrolled pain, respiratory distress.  Objective: Vitals:   01/13/18 2119 01/14/18 0609 01/14/18 1046 01/14/18 1100  BP: 128/86 (!) 92/59 (!) 174/147 (!) 126/105  Pulse: 85 84  (!) 112  Resp:  18    Temp: 98.1 F (36.7 C) 98.4 F (36.9 C)    TempSrc: Oral Oral    SpO2:  96% 99% 99%  Weight:      Height:        Intake/Output Summary (Last 24 hours) at 01/14/2018 1332 Last data filed at 01/14/2018 0916 Gross per 24 hour  Intake 240 ml  Output 2001 ml  Net -1761 ml   Weight change:  Exam:   General:  Pt is alert, does not follow commands appropriately, not in acute distress  HEENT: No icterus, No thrush, No neck mass, [place]/AT-scattered bruises on the patient's bilateral face and  forehead  Cardiovascular: RRR, S1/S2, no rubs, no gallops  Respiratory: CTA bilaterally, no wheezing, no crackles, no rhonchi  Abdomen: Soft/+BS, non tender, non distended, no guarding  Extremities: No edema, No lymphangitis, No petechiae, No rashes, no synovitis   Data Reviewed: I have personally reviewed following labs and imaging studies Basic Metabolic Panel: Recent Labs  Lab 01/12/18 1355 01/13/18 0421  NA 138 139  K 3.4* 3.7  CL 107 110  CO2 24 24  GLUCOSE 90 87  BUN 18 17  CREATININE 0.98 1.08  CALCIUM 8.4* 7.9*  MG  --  1.9   Liver Function Tests: No results for input(s): AST, ALT, ALKPHOS, BILITOT, PROT, ALBUMIN in the last 168 hours. No results for input(s): LIPASE, AMYLASE in the last 168 hours. No results for input(s): AMMONIA in the last 168 hours. Coagulation Profile: No results for input(s): INR, PROTIME in the last 168 hours. CBC: Recent Labs  Lab 01/12/18 1355  WBC 6.6  NEUTROABS 5.4  HGB 11.0*  HCT 33.3*  MCV 92.5  PLT 298   Cardiac Enzymes: No results for input(s): CKTOTAL, CKMB, CKMBINDEX, TROPONINI in the last 168 hours. BNP: Invalid input(s): POCBNP CBG: No results for input(s): GLUCAP in the last 168 hours. HbA1C: No results for input(s): HGBA1C in the last 72 hours. Urine analysis:    Component Value Date/Time   COLORURINE YELLOW 01/12/2018 1327   APPEARANCEUR CLEAR 01/12/2018 1327   LABSPEC 1.025 01/12/2018 1327   PHURINE 6.0 01/12/2018 1327   GLUCOSEU NEGATIVE 01/12/2018 1327   HGBUR NEGATIVE 01/12/2018 1327   BILIRUBINUR NEGATIVE 01/12/2018 1327   KETONESUR 20 (A) 01/12/2018 1327   PROTEINUR NEGATIVE 01/12/2018 1327   NITRITE NEGATIVE 01/12/2018 1327   LEUKOCYTESUR NEGATIVE 01/12/2018 1327   Sepsis Labs: @LABRCNTIP (procalcitonin:4,lacticidven:4) ) Recent Results (from the past 240 hour(s))  MRSA PCR Screening     Status: None   Collection Time: 01/12/18  9:16 PM  Result Value Ref Range Status   MRSA by PCR NEGATIVE  NEGATIVE Final    Comment:        The GeneXpert MRSA Assay (FDA approved for NASAL specimens only), is one component of a comprehensive MRSA colonization surveillance program. It is not intended to diagnose MRSA infection nor to guide or monitor treatment for MRSA infections. Performed at [facility], [address] [place], [place] [address]      Scheduled Meds: . benztropine  1 mg Oral Daily  . cyanocobalamin  1,000 mcg Intramuscular Daily  . enoxaparin (LOVENOX) injection  40 mg Subcutaneous Q24H  . folic acid  1 mg Oral Daily  . OLANZapine  5 mg Oral QHS  . pantoprazole  40 mg Oral Daily   Continuous Infusions:  Procedures/Studies: Dg Chest 1 View  Result Date: 01/12/2018 CLINICAL DATA:  Fall from chair. EXAM: CHEST  1 VIEW COMPARISON:  Chest x-ray dated 08/12/2013. FINDINGS: Heart size and mediastinal contours are within normal limits. Lungs are clear. No pleural effusion or pneumothorax seen. Osseous structures about the chest are unremarkable. IMPRESSION: No acute findings.  Lungs are clear. Electronically Signed   By: Bary Richard M.D.   On: 01/12/2018 11:44   Ct Head Wo Contrast  Result Date: 01/14/2018 CLINICAL DATA:  Ataxia post head trauma. Found down. EXAM: CT HEAD WITHOUT CONTRAST CT  CERVICAL SPINE WITHOUT CONTRAST TECHNIQUE: Multidetector CT imaging of the head and cervical spine was performed following the standard protocol without intravenous contrast. Multiplanar CT image reconstructions of the cervical spine were also generated. COMPARISON:  Head CT dated 01/12/2018. FINDINGS: CT HEAD FINDINGS Brain: New small focus of acute subdural hemorrhage overlying the anterior margin of the lower RIGHT temporal lobe, measuring 6 mm thickness. Small amount of associated traumatic subarachnoid hemorrhage within the adjacent sulci. No appreciable edema within the surrounding parenchyma. No significant mass effect. Additional trace subarachnoid hemorrhage within the sulcus of  the superior-lateral LEFT temporal lobe, also without evidence of surrounding parenchymal edema or mass effect. There is generalized age related parenchymal volume loss with commensurate dilatation of the ventricles and sulci. Ventricles are stable in size and configuration. Chronic small vessel ischemic changes again noted within the deep periventricular white matter regions bilaterally. Vascular: Chronic calcified atherosclerotic changes of the large vessels at the skull base. No unexpected hyperdense vessel. Skull: New nondisplaced fracture within the RIGHT frontotemporal bone. Additional new nondisplaced fracture within the RIGHT zygomatic arch. Additional nondisplaced fracture within the lateral wall of the LEFT orbit, stable appearance compared to the most recent head CT of 01/12/2018. Stable comminuted fracture of the LEFT zygomatic arch. Sinuses/Orbits: Fluid/mucosal thickening within the posterior ethmoid air cells. Orbital globes appear intact and symmetric in configuration. No retro-orbital hemorrhage. Other: Scalp edema/hematoma overlying the LEFT parietooccipital bone has slightly decreased in size. Incidental note made of a slightly displaced fracture within the posterior wall of the LEFT maxillary sinus, stable compared to the recent head CT. CT CERVICAL SPINE FINDINGS Alignment: Stable dextroscoliosis. No evidence of acute vertebral body subluxation. Skull base and vertebrae: Characterization of osseous detail is slightly limited by patient motion artifact, however, there is no fracture line or displaced fracture fragment seen. Facet joints appear normally aligned. Soft tissues and spinal canal: No prevertebral fluid or swelling. No visible canal hematoma. Disc levels: Degenerative spondylosis throughout the cervical spine, mild to moderate in degree. No more than mild central canal stenosis at any level. Upper chest: No acute findings. Other: None. IMPRESSION: 1. New small focus of acute subdural  hemorrhage overlying the anterior margin of the lower RIGHT temporal lobe, measuring 6 mm thickness. Small amount of associated traumatic subarachnoid hemorrhage within the adjacent sulci. No significant mass effect. 2. Additional trace subarachnoid hemorrhage within a sulcus of the superior-lateral LEFT temporal lobe, also without evidence of surrounding parenchymal edema or mass effect. 3. New nondisplaced fracture within the RIGHT frontotemporal bone. 4. New nondisplaced fracture within the RIGHT zygomatic arch. 5. Nondisplaced fracture within the lateral wall of the LEFT orbit, likely acute but stable compared to recent head CT of 01/12/2018. 6. Slightly displaced fracture within the posterior wall of the LEFT maxillary sinus. 7. Scalp edema/hematoma over the LEFT parietooccipital bone, slightly decreased in size compared to the previous study of 01/12/2018. 8. No fracture or acute subluxation within the cervical spine. Critical Value/emergent results were called by telephone at the time of interpretation on 01/14/2018 at 12:45 pm to Dr. Onalee Hua Harnoor Kohles , who verbally acknowledged these results. Electronically Signed   By: Bary Richard M.D.   On: 01/14/2018 13:00   Ct Head Wo Contrast  Result Date: 01/12/2018 CLINICAL DATA:  Patient found face down on floor. Patient states he was looking for is pants. EXAM: CT HEAD WITHOUT CONTRAST CT CERVICAL SPINE WITHOUT CONTRAST TECHNIQUE: Multidetector CT imaging of the head and cervical spine was performed following the standard protocol  without intravenous contrast. Multiplanar CT image reconstructions of the cervical spine were also generated. COMPARISON:  None. FINDINGS: CT HEAD FINDINGS Brain: No evidence of acute infarction, hemorrhage, hydrocephalus, extra-axial collection or mass lesion/mass effect. There is mild diffuse low-attenuation within the subcortical and periventricular white matter compatible with chronic microvascular disease. Prominence of the sulci and  ventricles compatible with brain atrophy. Vascular: No hyperdense vessel or unexpected calcification. Skull: Normal. Negative for fracture or focal lesion. Sinuses/Orbits: There is opacification of the right mastoid air cells. Mild mucosal thickening is noted within the left maxillary sinus. The remaining paranasal sinuses and the left mastoid air cells are clear. Other: Left posterior parietal scalp hematoma noted, image 56/5. CT CERVICAL SPINE FINDINGS Alignment: Normal. Skull base and vertebrae: No acute fracture. No primary bone lesion or focal pathologic process. Soft tissues and spinal canal: No prevertebral fluid or swelling. No visible canal hematoma. Disc levels: Ventral endplate spurring and disc space narrowing identified at C3-4 and C5-6. Upper chest: A chronic appearing deformity involving the left shoulder including the distal clavicle and scapula identified. Other: None IMPRESSION: 1. No acute intracranial abnormalities. 2. Left posterior parietal scalp hematoma. 3. Chronic small vessel ischemic change and brain atrophy. 4. Right mastoid air cell opacification. 5. No evidence for cervical spine fracture 6. Cervical spine degenerative disc disease. Electronically Signed   By: Signa Kell M.D.   On: 01/12/2018 15:23   Ct Cervical Spine Wo Contrast  Result Date: 01/14/2018 CLINICAL DATA:  Ataxia post head trauma. Found down. EXAM: CT HEAD WITHOUT CONTRAST CT CERVICAL SPINE WITHOUT CONTRAST TECHNIQUE: Multidetector CT imaging of the head and cervical spine was performed following the standard protocol without intravenous contrast. Multiplanar CT image reconstructions of the cervical spine were also generated. COMPARISON:  Head CT dated 01/12/2018. FINDINGS: CT HEAD FINDINGS Brain: New small focus of acute subdural hemorrhage overlying the anterior margin of the lower RIGHT temporal lobe, measuring 6 mm thickness. Small amount of associated traumatic subarachnoid hemorrhage within the adjacent  sulci. No appreciable edema within the surrounding parenchyma. No significant mass effect. Additional trace subarachnoid hemorrhage within the sulcus of the superior-lateral LEFT temporal lobe, also without evidence of surrounding parenchymal edema or mass effect. There is generalized age related parenchymal volume loss with commensurate dilatation of the ventricles and sulci. Ventricles are stable in size and configuration. Chronic small vessel ischemic changes again noted within the deep periventricular white matter regions bilaterally. Vascular: Chronic calcified atherosclerotic changes of the large vessels at the skull base. No unexpected hyperdense vessel. Skull: New nondisplaced fracture within the RIGHT frontotemporal bone. Additional new nondisplaced fracture within the RIGHT zygomatic arch. Additional nondisplaced fracture within the lateral wall of the LEFT orbit, stable appearance compared to the most recent head CT of 01/12/2018. Stable comminuted fracture of the LEFT zygomatic arch. Sinuses/Orbits: Fluid/mucosal thickening within the posterior ethmoid air cells. Orbital globes appear intact and symmetric in configuration. No retro-orbital hemorrhage. Other: Scalp edema/hematoma overlying the LEFT parietooccipital bone has slightly decreased in size. Incidental note made of a slightly displaced fracture within the posterior wall of the LEFT maxillary sinus, stable compared to the recent head CT. CT CERVICAL SPINE FINDINGS Alignment: Stable dextroscoliosis. No evidence of acute vertebral body subluxation. Skull base and vertebrae: Characterization of osseous detail is slightly limited by patient motion artifact, however, there is no fracture line or displaced fracture fragment seen. Facet joints appear normally aligned. Soft tissues and spinal canal: No prevertebral fluid or swelling. No visible canal hematoma. Disc levels:  Degenerative spondylosis throughout the cervical spine, mild to moderate in  degree. No more than mild central canal stenosis at any level. Upper chest: No acute findings. Other: None. IMPRESSION: 1. New small focus of acute subdural hemorrhage overlying the anterior margin of the lower RIGHT temporal lobe, measuring 6 mm thickness. Small amount of associated traumatic subarachnoid hemorrhage within the adjacent sulci. No significant mass effect. 2. Additional trace subarachnoid hemorrhage within a sulcus of the superior-lateral LEFT temporal lobe, also without evidence of surrounding parenchymal edema or mass effect. 3. New nondisplaced fracture within the RIGHT frontotemporal bone. 4. New nondisplaced fracture within the RIGHT zygomatic arch. 5. Nondisplaced fracture within the lateral wall of the LEFT orbit, likely acute but stable compared to recent head CT of 01/12/2018. 6. Slightly displaced fracture within the posterior wall of the LEFT maxillary sinus. 7. Scalp edema/hematoma over the LEFT parietooccipital bone, slightly decreased in size compared to the previous study of 01/12/2018. 8. No fracture or acute subluxation within the cervical spine. Critical Value/emergent results were called by telephone at the time of interpretation on 01/14/2018 at 12:45 pm to Dr. Onalee HuaAVID Lexis Potenza , who verbally acknowledged these results. Electronically Signed   By: Bary RichardStan  Maynard M.D.   On: 01/14/2018 13:00   Ct Cervical Spine Wo Contrast  Result Date: 01/12/2018 CLINICAL DATA:  Patient found face down on floor. Patient states he was looking for is pants. EXAM: CT HEAD WITHOUT CONTRAST CT CERVICAL SPINE WITHOUT CONTRAST TECHNIQUE: Multidetector CT imaging of the head and cervical spine was performed following the standard protocol without intravenous contrast. Multiplanar CT image reconstructions of the cervical spine were also generated. COMPARISON:  None. FINDINGS: CT HEAD FINDINGS Brain: No evidence of acute infarction, hemorrhage, hydrocephalus, extra-axial collection or mass lesion/mass effect.  There is mild diffuse low-attenuation within the subcortical and periventricular white matter compatible with chronic microvascular disease. Prominence of the sulci and ventricles compatible with brain atrophy. Vascular: No hyperdense vessel or unexpected calcification. Skull: Normal. Negative for fracture or focal lesion. Sinuses/Orbits: There is opacification of the right mastoid air cells. Mild mucosal thickening is noted within the left maxillary sinus. The remaining paranasal sinuses and the left mastoid air cells are clear. Other: Left posterior parietal scalp hematoma noted, image 56/5. CT CERVICAL SPINE FINDINGS Alignment: Normal. Skull base and vertebrae: No acute fracture. No primary bone lesion or focal pathologic process. Soft tissues and spinal canal: No prevertebral fluid or swelling. No visible canal hematoma. Disc levels: Ventral endplate spurring and disc space narrowing identified at C3-4 and C5-6. Upper chest: A chronic appearing deformity involving the left shoulder including the distal clavicle and scapula identified. Other: None IMPRESSION: 1. No acute intracranial abnormalities. 2. Left posterior parietal scalp hematoma. 3. Chronic small vessel ischemic change and brain atrophy. 4. Right mastoid air cell opacification. 5. No evidence for cervical spine fracture 6. Cervical spine degenerative disc disease. Electronically Signed   By: Signa Kellaylor  Stroud M.D.   On: 01/12/2018 15:23   Ct Pelvis Wo Contrast  Result Date: 01/12/2018 CLINICAL DATA:  Fall today from chair.  Negative plain films. EXAM: CT PELVIS WITHOUT CONTRAST TECHNIQUE: Multidetector CT imaging of the pelvis was performed following the standard protocol without intravenous contrast. COMPARISON:  Pelvis/hip x-rays today. FINDINGS: Urinary Tract:  Within normal. Bowel: Mild diverticulosis of the colon. Moderate segment of sigmoid colon extends into a left inguinal hernia down into the left scrotum. No obstruction. Vascular/Lymphatic:  Calcified plaque over the abdominal aorta. No adenopathy. Reproductive:  Unremarkable. Other:  Left inguinal hernia as described above.  No free fluid. Musculoskeletal: No significant degenerative changes of the hips. No acute fracture or dislocation. Minimal degenerate change of the spine. IMPRESSION: No acute fracture. Left inguinal hernia containing a moderate segment of colon which extends down into the left Colonic diverticulosis. Aortic Atherosclerosis (ICD10-I70.0). Scrotum. Electronically Signed   By: Elberta Fortis M.D.   On: 01/12/2018 15:24   Dg Knee Complete 4 Views Left  Result Date: 01/12/2018 CLINICAL DATA:  Fall.  Slid out of a chair.  Initial encounter. EXAM: LEFT KNEE - COMPLETE 4+ VIEW COMPARISON:  None. FINDINGS: There is no evidence of acute fracture, dislocation, or knee joint effusion. No significant arthropathic changes are identified. Atherosclerotic vascular calcifications are noted. IMPRESSION: Negative. Electronically Signed   By: Sebastian Ache M.D.   On: 01/12/2018 11:38   Dg Knee Complete 4 Views Right  Result Date: 01/12/2018 CLINICAL DATA:  Fall from chair EXAM: RIGHT KNEE - COMPLETE 4+ VIEW COMPARISON:  None. FINDINGS: Osseous alignment is normal. No fracture line or displaced fracture fragment seen. No acute or suspicious osseous lesion. No appreciable joint effusion and adjacent soft tissues are unremarkable. IMPRESSION: Negative. Electronically Signed   By: Bary Richard M.D.   On: 01/12/2018 11:43   Dg Hips Bilat W Or Wo Pelvis 2 Views  Result Date: 01/12/2018 CLINICAL DATA:  Fall from chair. EXAM: DG HIP (WITH OR WITHOUT PELVIS) 2V BILAT COMPARISON:  None. FINDINGS: Osseous alignment is normal. No fracture line or displaced fracture fragment seen. No degenerative change at either hip joint. Bowel is noted within the scrotal sac, consistent with large hernia. Soft tissues about the pelvis and hips are otherwise unremarkable. IMPRESSION: 1. No osseous fracture or  dislocation. 2. Herniated bowel within the scrotal sac. Electronically Signed   By: Bary Richard M.D.   On: 01/12/2018 11:40    Catarina Hartshorn, DO  [facility] Pager [phone]  If 7PM-7AM, please contact night-coverage [email or web address] Password [email or web address] 01/14/2018, 1:32 PM   LOS: 0 days

## 2018-01-14 NOTE — Evaluation (Signed)
Clinical/Bedside Swallow Evaluation Patient Details  Name: Thomas Foley MRN: [id number] Date of Birth: Nov 19, 1945  Today's Date: 01/14/2018 Time: SLP Start Time (ACUTE ONLY): 4098 SLP Stop Time (ACUTE ONLY): 0836 SLP Time Calculation (min) (ACUTE ONLY): 25 min  Past Medical History:  Past Medical History:  Diagnosis Date  . COPD (chronic obstructive pulmonary disease) (HCC)   . GERD (gastroesophageal reflux disease)    Past Surgical History: History reviewed. No pertinent surgical history. HPI:  Thomas Foley is a 72 y.o. male with medical history significant for dementia, COPD who was brought to the ED after an unwitnessed fall today.  Unable to obtain history from patient due to patient's significant dementia, is resident at a care home.  History is obtained from ED providers and chart review.  Patient spends a good part of last night walking and was very active and unable to sleep.  This morning he was found sitting on the floor beside the bed.  Patient was been unable to bear weight.  Notable skin tears on patient's upper extremities because patient constantly picks at them.  At baseline patient is ambulatory but confused and mumbles and requires assistance with ADLs.   Assessment / Plan / Recommendation Clinical Impression  Pt was roused from sleep and immediately became restless and was perseverating on needing to go to the restroom and trying to get out of bed. Pt is edentulous; SLP provided trials of regular breakfast tray including thin liquids and mechanical soft (biscuit). Pt with higher aspiration risk secondary to poor awareness of bolus resulting from advanced dementia across consistencies however once mastication or bolus transit was initiated pharyngeal swallow seemed to be functional without overt s/sx of aspiration. Suspect awareness would improve with Pt self feeding however Pt is currently unable to do so secondary to wearing mits to keep Pt from removing catheter/cords/tubes. Pt  was very impulsive and requested larger bites than appropriate, with max cues took appropriate sized bites. Pt was unable to consistently suck on the straw Pt more effectively drank from the cup. Recommend downgrade to D3/mechanical soft diet and thin liquids; meds to be crushed in applesauce. Recommend 1:1 feeder to control rate and implement aspiration precuations. There are no further ST needs noted at this time, ST to sign off. SLP Visit Diagnosis: Dysphagia, unspecified (R13.10)    Aspiration Risk  Mild aspiration risk    Diet Recommendation Dysphagia 3 (Mech soft);Thin liquid   Liquid Administration via: Cup Medication Administration: Crushed with puree Supervision: Patient able to self feed Compensations: Minimize environmental distractions;Slow rate;Small sips/bites Postural Changes: Seated upright at 90 degrees;Remain upright for at least 30 minutes after po intake    Other  Recommendations Oral Care Recommendations: Oral care BID   Follow up Recommendations 24 hour supervision/assistance;Skilled Nursing facility      Frequency and Duration            Prognosis Prognosis for Safe Diet Advancement: Fair Barriers to Reach Goals: Cognitive deficits;Severity of deficits      Swallow Study   General Date of Onset: 01/12/18 HPI: Thomas Foley is a 72 y.o. male with medical history significant for dementia, COPD who was brought to the ED after an unwitnessed fall today.  Unable to obtain history from patient due to patient's significant dementia, is resident at a care home.  History is obtained from ED providers and chart review.  Patient spends a good part of last night walking and was very active and unable to sleep.  This morning he was  found sitting on the floor beside the bed.  Patient was been unable to bear weight.  Notable skin tears on patient's upper extremities because patient constantly picks at them.  At baseline patient is ambulatory but confused and mumbles and requires  assistance with ADLs. Type of Study: Bedside Swallow Evaluation Diet Prior to this Study: Regular;Thin liquids Temperature Spikes Noted: No Respiratory Status: Room air History of Recent Intubation: No Behavior/Cognition: Alert;Cooperative Oral Cavity Assessment: Within Functional Limits Oral Care Completed by SLP: Recent completion by staff Oral Cavity - Dentition: Edentulous Vision: Functional for self-feeding Self-Feeding Abilities: Able to feed self Patient Positioning: Upright in bed Baseline Vocal Quality: Normal Volitional Cough: Cognitively unable to elicit Volitional Swallow: Unable to elicit    Oral/Motor/Sensory Function Overall Oral Motor/Sensory Function: Within functional limits   Ice Chips Ice chips: Within functional limits   Thin Liquid Thin Liquid: Impaired Presentation: Cup Oral Phase Impairments: Poor awareness of bolus Pharyngeal  Phase Impairments: Throat Clearing - Immediate    Nectar Thick Nectar Thick Liquid: Not tested   Honey Thick Honey Thick Liquid: Not tested   Puree Puree: Within functional limits   Solid     Solid: Within functional limits     Traye Bates H. Romie LeveeYarbrough MA, CCC-SLP Speech Language Pathologist  Georgetta HaberAmelia H Merion Grimaldo 01/14/2018,11:56 AM

## 2018-01-14 NOTE — Progress Notes (Signed)
Attempted to call DSS guardian Alisia FerrariBailey Sively regarding patients fall that occurred during this admission. No answer, voicemail left. Will continue to monitor.

## 2018-01-14 NOTE — Progress Notes (Signed)
Patient has remained restless and agitated throughout night. Attempts at redirection unsuccessful. Patient continues to pull at lines and attempt to get out of bed. Safety mitts in place, sitter at bedside.

## 2018-01-15 DIAGNOSIS — I471 Supraventricular tachycardia: Secondary | ICD-10-CM

## 2018-01-15 LAB — BASIC METABOLIC PANEL
Anion gap: 9 (ref 5–15)
BUN: 10 mg/dL (ref 8–23)
CO2: 22 mmol/L (ref 22–32)
Calcium: 8.1 mg/dL — ABNORMAL LOW (ref 8.9–10.3)
Chloride: 105 mmol/L (ref 98–111)
Creatinine, Ser: 0.87 mg/dL (ref 0.61–1.24)
GFR calc non Af Amer: >60 mL/min (ref >60)
Glucose, Bld: 119 mg/dL — ABNORMAL HIGH (ref 70–99)
POTASSIUM: 3.6 mmol/L (ref 3.5–5.1)
Sodium: 136 mmol/L (ref 135–145)

## 2018-01-15 LAB — CBC
HCT: 33.8 % — ABNORMAL LOW (ref 39.0–52.0)
Hemoglobin: 11.1 g/dL — ABNORMAL LOW (ref 13.0–17.0)
MCH: 29.5 pg (ref 26.0–34.0)
MCHC: 32.8 g/dL (ref 30.0–36.0)
MCV: 89.9 fL (ref 80.0–100.0)
NRBC: 0 % (ref 0.0–0.2)
Platelets: 319 10*3/uL (ref 150–400)
RBC: 3.76 MIL/uL — ABNORMAL LOW (ref 4.22–5.81)
RDW: 14.2 % (ref 11.5–15.5)
WBC: 7.9 10*3/uL (ref 4.0–10.5)

## 2018-01-15 LAB — CK: CK TOTAL: 786 U/L — AB (ref 49–397)

## 2018-01-15 MED ORDER — METOPROLOL TARTRATE 25 MG PO TABS: 12.5000 mg | ORAL_TABLET | Freq: Two times a day (BID) | ORAL | 0 refills | Status: DC

## 2018-01-15 MED ORDER — METOPROLOL TARTRATE 25 MG PO TABS
12.5000 mg | ORAL_TABLET | Freq: Two times a day (BID) | ORAL | Status: DC
  Administered 2018-01-15 – 2018-01-16 (×2): 12.5 mg via ORAL
  Filled 2018-01-15 (×3): qty 1

## 2018-01-15 NOTE — Clinical Social Work Placement (Signed)
   CLINICAL SOCIAL WORK PLACEMENT  NOTE  Date:  01/15/2018  Patient Details  Name: Thomas JarvisJames Firkus MRN: [id number] Date of Birth: 12/07/1945  Clinical Social Work is seeking post-discharge placement for this patient at the Skilled  Nursing Facility level of care (*CSW will initial, date and re-position this form in  chart as items are completed):  Yes   Patient/family provided with [facility] Social Work Department's list of facilities offering this level of care within the geographic area requested by the patient (or if unable, by the patient's family).  Yes   Patient/family informed of their freedom to choose among providers that offer the needed level of care, that participate in Medicare, Medicaid or managed care program needed by the patient, have an available bed and are willing to accept the patient.  Yes   Patient/family informed of [facility] ownership interest [facility] and [facility], as well as of the fact that they are under no obligation to receive care at these facilities.  PASRR submitted to EDS on 01/14/18     PASRR number received on 01/14/18     Existing PASRR number confirmed on       FL2 transmitted to all facilities in geographic area requested by pt/family on 01/15/18     FL2 transmitted to all facilities within larger geographic area on       Patient informed that his/her managed care company has contracts with or will negotiate with certain facilities, including the following:            Patient/family informed of bed offers received.  Patient chooses bed at       Physician recommends and patient chooses bed at      Patient to be transferred to   on  .  Patient to be transferred to facility by       Patient family notified on   of transfer.  Name of family member notified:        PHYSICIAN       Additional Comment:      Winfield RastBailey Sively  [place] DSS [phone] is guardian.  Informed her of referrals to [facility],  [facility] and [facility].   _______________________________________________ Ida Rogueodney B Mintie Witherington, LCSW 01/15/2018, 1:24 PM

## 2018-01-15 NOTE — Progress Notes (Signed)
Physical Therapy Treatment Patient Details Name: Thomas Foley MRN: [id number] DOB: [birth year] Today's Date: 01/15/2018    History of Present Illness Thomas Foley is a 72 y.o. male with medical history significant for dementia, COPD who was brought to the ED after an unwitnessed fall today.  Unable to obtain history from patient due to patient's significant dementia, is resident at a care home.  History is obtained from ED providers and chart review.  Patient spends a good part of last night walking and was very active and unable to sleep.  This morning he was found sitting on the floor beside the bed.  Patient was been unable to bear weight.  Notable skin tears on patient's upper extremities because patient constantly picks at them.  At baseline patient is ambulatory but confused and mumbles and requires assistance with ADLs.    PT Comments    REASSESSMENT:  Patient able to follow simple instructions with verbal/tactile cueing, presents with severe rigidity of BLE requiring assistance to move legs, patient able to move legs voluntarily, but limited possibly due to sedation and or head injury after fall.  Patient limited to a few side steps at bedside, unsafe to step away from bedside due to poor standing balance and weakness.  Patient fatigued after 6-7 minutes of standing/taking steps, had bowel movement, then went into severe posterior lean and had to be lowered back into bed and unable to sit up after that.  Patient will benefit from continued physical therapy in hospital with same treatment plan/goals and recommended venue below to increase strength, balance, endurance for safe ADLs and gait.   Follow Up Recommendations  SNF     Equipment Recommendations  None recommended by PT    Recommendations for Other Services       Precautions / Restrictions Precautions Precautions: Fall Restrictions Weight Bearing Restrictions: No    Mobility  Bed Mobility Overal bed mobility: Needs  Assistance Bed Mobility: Supine to Sit;Sit to Supine     Supine to sit: Max assist Sit to supine: Mod assist;Max assist   General bed mobility comments: requires max tactile/verbal cueing  Transfers Overall transfer level: Needs assistance Equipment used: Rolling walker (2 wheeled) Transfers: Sit to/from Stand Sit to Stand: Max assist         General transfer comment: severe posterior lean  Ambulation/Gait Ambulation/Gait assistance: Max assist Gait Distance (Feet): 4 Feet Assistive device: Rolling walker (2 wheeled) Gait Pattern/deviations: Decreased step length - right;Decreased step length - left;Decreased stride length Gait velocity: slow   General Gait Details: limited to 4-5 unsteady labored short side steps at bedside due to poor standing balance with severe posterior lean   Stairs             Wheelchair Mobility    Modified Rankin (Stroke Patients Only)       Balance Overall balance assessment: Needs assistance Sitting-balance support: Feet supported;No upper extremity supported Sitting balance-Leahy Scale: Fair Sitting balance - Comments: fair static, fair/poor dynamic   Standing balance support: During functional activity;Bilateral upper extremity supported Standing balance-Leahy Scale: Poor Standing balance comment: using RW                            Cognition Arousal/Alertness: Awake/alert Behavior During Therapy: Restless;Impulsive;Flat affect Overall Cognitive Status: History of cognitive impairments - at baseline  Exercises      General Comments        Pertinent Vitals/Pain Pain Assessment: No/denies pain    Home Living                      Prior Function            PT Goals (current goals can now be found in the care plan section) Acute Rehab PT Goals Patient Stated Goal: not stated PT Goal Formulation: With patient Time For Goal Achievement:  01/27/18 Potential to Achieve Goals: Fair Progress towards PT goals: Progressing toward goals    Frequency    Min 3X/week      PT Plan Current plan remains appropriate    Co-evaluation              AM-PAC PT "6 Clicks" Mobility   Outcome Measure  Help needed turning from your back to your side while in a flat bed without using bedrails?: Total Help needed moving from lying on your back to sitting on the side of a flat bed without using bedrails?: Total Help needed moving to and from a bed to a chair (including a wheelchair)?: Total Help needed standing up from a chair using your arms (e.g., wheelchair or bedside chair)?: A Lot Help needed to walk in hospital room?: Total Help needed climbing 3-5 steps with a railing? : Total 6 Click Score: 7    End of Session Equipment Utilized During Treatment: Gait belt Activity Tolerance: Patient limited by fatigue;Patient limited by lethargy Patient left: in bed;with call bell/phone within reach;with nursing/sitter in room Nurse Communication: Mobility status PT Visit Diagnosis: Unsteadiness on feet (R26.81);Other abnormalities of gait and mobility (R26.89);Muscle weakness (generalized) (M62.81)     Time: [id number] PT Time Calculation (min) (ACUTE ONLY): 31 min  Charges:  $Therapeutic Activity: 23-37 mins                     2:19 PM, 01/15/18 Ocie BobJames Lindley Stachnik, MPT Physical Therapist with [facility] 336 [phone] office [phone] mobile phone

## 2018-01-15 NOTE — Discharge Summary (Signed)
Physician Discharge Summary  Thomas Foley ZOX:[id number] DOB: 04-08-1945 DOA: 01/12/2018  PCP: Avon Gully, MD  Admit date: 01/12/2018 Discharge date: 01/15/2018  Admitted From: [facility] Disposition:  SNF  Recommendations for Outpatient Follow-up:  1. Follow up with PCP in 1-2 weeks 2. Please obtain BMP/CBC in one week   Discharge Condition: Stable CODE STATUS: FULL Diet recommendation: dysphagia 3 with thin liquids   Brief/Interim Summary: 72 year old male with history of severe dementia, COPD brought to the ED after unwitnessed fall. He is a resident of a care home. At baseline reportedly he is ambulatory but confused and requiring assistance with all his ADLs. History very limited. In the ED vitals were stable. Blood work was unremarkable. He fell on the floor in the ED. X-rays and head CT negative for any fracture, showed posterior scalp hematoma and chronic small ischemic vessel changes. Patient placed in observation for further management and physical therapy evaluation. During his hospitalization, the patient had intermittent episodes of hospital delirium with agitation.  He required Haldol and Ativan.  Unfortunately, the patient sustained a mechanical fall on the morning of 01/14/2018.  CT of the head showed new small subdural hemorrhage measuring 6 mm in the right temporal lobe.  There is a small amount of traumatic subarachnoid hemorrhage in the adjacent sulci.  There is a trace subdural hemorrhage in the lateral temporal lobe on the left.  There is also a nondisplaced mandibular fracture on the right frontal temporal bone as well as the right zygomatic arch.  There was a nondisplaced fracture of the lateral wall of the left orbit and left maxillary sinus.  The case was discussed with neurosurgery and ENT trauma.  They both recommended conservative, nonoperative management.  Discharge Diagnoses:  Subdural hematoma/subarachnoid hemorrhage -01/14/2018 CT brain--subdural  hemorrhage and subarachnoid hemorrhage as discussed above -Case was discussed with neurosurgery (Dr. Danielle Dess) and ENT trauma--> recommend nonoperative, conservative management -Discontinue enoxaparin -Place SCDs  Facial fractures/maxillary sinus fracture -Secondary to mechanical fall -Case discussed with ENT trauma (Dr. Thayer Ohm Newman)--> conservative management, nonoperative management  Gait Instability/Frequent Falls -TSH 0.677 -Urinalysis negative for pyuria -Magnesium 1.9 -Serum B12 149 -RBC folate--1896 -CK--893  Paroxsymal SVT -personally reviewed EKG--SVT without concerning STT changes -start metoprolol 12.5 mg bid -TSH--0.677  Mild rhabdomyolysis-traumatic -CPK 893>>>786 -Started IV fluids -Repeat CPK trending down  B12 deficiency -Supplement IM during hospitalization -Discharged with oral supplementation -Repeat serum B12 in 1 month  Hypokalemia -Repleted -Magnesium 1.9  Cognitive impairment/dementia -Continue olanzapine at bedtime and Cogentin -Haldol as needed agitation -Patient is at risk for hospital delirium -Patient has episodes of impulsiveness-->patient had mechanical fall a.m. 01/14/2018  Dehydration -pt received IVF during the hospitalization  Stage I pressure injury--sacrum -Local wound care -Not infected on exam -Present prior to admission    Discharge Instructions   Allergies as of 01/15/2018   No Known Allergies     Medication List    TAKE these medications   acetaminophen 500 MG tablet Commonly known as:  TYLENOL Take 2 tablets (1,000 mg total) by mouth every 6 (six) hours as needed for mild pain or moderate pain. What changed:  when to take this   albuterol 108 (90 Base) MCG/ACT inhaler Commonly known as:  PROVENTIL HFA;VENTOLIN HFA Inhale 1-2 puffs into the lungs every 6 (six) hours as needed for wheezing or shortness of breath.   benztropine 1 MG tablet Commonly known as:  COGENTIN Take 1 tablet by mouth daily.    CALCIUM 600+D 600-400 MG-UNIT tablet Generic drug:  Calcium Carbonate-Vitamin D Take 1 tablet by mouth 3 (three) times daily with meals.   [employer] MELATONIN 3 MG Tabs Generic drug:  Melatonin Take 3 mg by mouth at bedtime.   folic acid 1 MG tablet Commonly known as:  FOLVITE Take 1 mg by mouth daily.   LORazepam 0.5 MG tablet Commonly known as:  ATIVAN Take 0.5 mg by mouth every 8 (eight) hours as needed for anxiety.   metoprolol tartrate 25 MG tablet Commonly known as:  LOPRESSOR Take 0.5 tablets (12.5 mg total) by mouth 2 (two) times daily.   OLANZapine 5 MG tablet Commonly known as:  ZYPREXA Take 1 tablet by mouth at bedtime.   Omega-3 1000 MG Caps Take 1 capsule by mouth 2 (two) times daily.   omeprazole 20 MG capsule Commonly known as:  PRILOSEC Take 20 mg by mouth daily.   vitamin B-12 500 MCG tablet Commonly known as:  CYANOCOBALAMIN Take 1 tablet (500 mcg total) by mouth daily.      Follow-up Information    Schedule an appointment as soon as possible for a visit  with Avon Gully, MD.   Specialty:  Internal Medicine Why:  Call for a recheck appointment within 1 week. Contact information: [address] [place] [address] [phone]          No Known Allergies  Consultations:  none   Procedures/Studies: Dg Chest 1 View  Result Date: 01/12/2018 CLINICAL DATA:  Fall from chair. EXAM: CHEST  1 VIEW COMPARISON:  Chest x-ray dated 08/12/2013. FINDINGS: Heart size and mediastinal contours are within normal limits. Lungs are clear. No pleural effusion or pneumothorax seen. Osseous structures about the chest are unremarkable. IMPRESSION: No acute findings.  Lungs are clear. Electronically Signed   By: Bary Richard M.D.   On: 01/12/2018 11:44   Ct Head Wo Contrast  Result Date: 01/14/2018 CLINICAL DATA:  Ataxia post head trauma. Found down. EXAM: CT HEAD WITHOUT CONTRAST CT CERVICAL SPINE WITHOUT CONTRAST TECHNIQUE: Multidetector CT  imaging of the head and cervical spine was performed following the standard protocol without intravenous contrast. Multiplanar CT image reconstructions of the cervical spine were also generated. COMPARISON:  Head CT dated 01/12/2018. FINDINGS: CT HEAD FINDINGS Brain: New small focus of acute subdural hemorrhage overlying the anterior margin of the lower RIGHT temporal lobe, measuring 6 mm thickness. Small amount of associated traumatic subarachnoid hemorrhage within the adjacent sulci. No appreciable edema within the surrounding parenchyma. No significant mass effect. Additional trace subarachnoid hemorrhage within the sulcus of the superior-lateral LEFT temporal lobe, also without evidence of surrounding parenchymal edema or mass effect. There is generalized age related parenchymal volume loss with commensurate dilatation of the ventricles and sulci. Ventricles are stable in size and configuration. Chronic small vessel ischemic changes again noted within the deep periventricular white matter regions bilaterally. Vascular: Chronic calcified atherosclerotic changes of the large vessels at the skull base. No unexpected hyperdense vessel. Skull: New nondisplaced fracture within the RIGHT frontotemporal bone. Additional new nondisplaced fracture within the RIGHT zygomatic arch. Additional nondisplaced fracture within the lateral wall of the LEFT orbit, stable appearance compared to the most recent head CT of 01/12/2018. Stable comminuted fracture of the LEFT zygomatic arch. Sinuses/Orbits: Fluid/mucosal thickening within the posterior ethmoid air cells. Orbital globes appear intact and symmetric in configuration. No retro-orbital hemorrhage. Other: Scalp edema/hematoma overlying the LEFT parietooccipital bone has slightly decreased in size. Incidental note made of a slightly displaced fracture within the posterior wall of the LEFT  maxillary sinus, stable compared to the recent head CT. CT CERVICAL SPINE FINDINGS  Alignment: Stable dextroscoliosis. No evidence of acute vertebral body subluxation. Skull base and vertebrae: Characterization of osseous detail is slightly limited by patient motion artifact, however, there is no fracture line or displaced fracture fragment seen. Facet joints appear normally aligned. Soft tissues and spinal canal: No prevertebral fluid or swelling. No visible canal hematoma. Disc levels: Degenerative spondylosis throughout the cervical spine, mild to moderate in degree. No more than mild central canal stenosis at any level. Upper chest: No acute findings. Other: None. IMPRESSION: 1. New small focus of acute subdural hemorrhage overlying the anterior margin of the lower RIGHT temporal lobe, measuring 6 mm thickness. Small amount of associated traumatic subarachnoid hemorrhage within the adjacent sulci. No significant mass effect. 2. Additional trace subarachnoid hemorrhage within a sulcus of the superior-lateral LEFT temporal lobe, also without evidence of surrounding parenchymal edema or mass effect. 3. New nondisplaced fracture within the RIGHT frontotemporal bone. 4. New nondisplaced fracture within the RIGHT zygomatic arch. 5. Nondisplaced fracture within the lateral wall of the LEFT orbit, likely acute but stable compared to recent head CT of 01/12/2018. 6. Slightly displaced fracture within the posterior wall of the LEFT maxillary sinus. 7. Scalp edema/hematoma over the LEFT parietooccipital bone, slightly decreased in size compared to the previous study of 01/12/2018. 8. No fracture or acute subluxation within the cervical spine. Critical Value/emergent results were called by telephone at the time of interpretation on 01/14/2018 at 12:45 pm to Dr. Onalee Hua Jachin Coury , who verbally acknowledged these results. Electronically Signed   By: Bary Richard M.D.   On: 01/14/2018 13:00   Ct Head Wo Contrast  Result Date: 01/12/2018 CLINICAL DATA:  Patient found face down on floor. Patient states he was  looking for is pants. EXAM: CT HEAD WITHOUT CONTRAST CT CERVICAL SPINE WITHOUT CONTRAST TECHNIQUE: Multidetector CT imaging of the head and cervical spine was performed following the standard protocol without intravenous contrast. Multiplanar CT image reconstructions of the cervical spine were also generated. COMPARISON:  None. FINDINGS: CT HEAD FINDINGS Brain: No evidence of acute infarction, hemorrhage, hydrocephalus, extra-axial collection or mass lesion/mass effect. There is mild diffuse low-attenuation within the subcortical and periventricular white matter compatible with chronic microvascular disease. Prominence of the sulci and ventricles compatible with brain atrophy. Vascular: No hyperdense vessel or unexpected calcification. Skull: Normal. Negative for fracture or focal lesion. Sinuses/Orbits: There is opacification of the right mastoid air cells. Mild mucosal thickening is noted within the left maxillary sinus. The remaining paranasal sinuses and the left mastoid air cells are clear. Other: Left posterior parietal scalp hematoma noted, image 56/5. CT CERVICAL SPINE FINDINGS Alignment: Normal. Skull base and vertebrae: No acute fracture. No primary bone lesion or focal pathologic process. Soft tissues and spinal canal: No prevertebral fluid or swelling. No visible canal hematoma. Disc levels: Ventral endplate spurring and disc space narrowing identified at C3-4 and C5-6. Upper chest: A chronic appearing deformity involving the left shoulder including the distal clavicle and scapula identified. Other: None IMPRESSION: 1. No acute intracranial abnormalities. 2. Left posterior parietal scalp hematoma. 3. Chronic small vessel ischemic change and brain atrophy. 4. Right mastoid air cell opacification. 5. No evidence for cervical spine fracture 6. Cervical spine degenerative disc disease. Electronically Signed   By: Signa Kell M.D.   On: 01/12/2018 15:23   Ct Cervical Spine Wo Contrast  Result Date:  01/14/2018 CLINICAL DATA:  Ataxia post head trauma. Found down. EXAM:  CT HEAD WITHOUT CONTRAST CT CERVICAL SPINE WITHOUT CONTRAST TECHNIQUE: Multidetector CT imaging of the head and cervical spine was performed following the standard protocol without intravenous contrast. Multiplanar CT image reconstructions of the cervical spine were also generated. COMPARISON:  Head CT dated 01/12/2018. FINDINGS: CT HEAD FINDINGS Brain: New small focus of acute subdural hemorrhage overlying the anterior margin of the lower RIGHT temporal lobe, measuring 6 mm thickness. Small amount of associated traumatic subarachnoid hemorrhage within the adjacent sulci. No appreciable edema within the surrounding parenchyma. No significant mass effect. Additional trace subarachnoid hemorrhage within the sulcus of the superior-lateral LEFT temporal lobe, also without evidence of surrounding parenchymal edema or mass effect. There is generalized age related parenchymal volume loss with commensurate dilatation of the ventricles and sulci. Ventricles are stable in size and configuration. Chronic small vessel ischemic changes again noted within the deep periventricular white matter regions bilaterally. Vascular: Chronic calcified atherosclerotic changes of the large vessels at the skull base. No unexpected hyperdense vessel. Skull: New nondisplaced fracture within the RIGHT frontotemporal bone. Additional new nondisplaced fracture within the RIGHT zygomatic arch. Additional nondisplaced fracture within the lateral wall of the LEFT orbit, stable appearance compared to the most recent head CT of 01/12/2018. Stable comminuted fracture of the LEFT zygomatic arch. Sinuses/Orbits: Fluid/mucosal thickening within the posterior ethmoid air cells. Orbital globes appear intact and symmetric in configuration. No retro-orbital hemorrhage. Other: Scalp edema/hematoma overlying the LEFT parietooccipital bone has slightly decreased in size. Incidental note made of  a slightly displaced fracture within the posterior wall of the LEFT maxillary sinus, stable compared to the recent head CT. CT CERVICAL SPINE FINDINGS Alignment: Stable dextroscoliosis. No evidence of acute vertebral body subluxation. Skull base and vertebrae: Characterization of osseous detail is slightly limited by patient motion artifact, however, there is no fracture line or displaced fracture fragment seen. Facet joints appear normally aligned. Soft tissues and spinal canal: No prevertebral fluid or swelling. No visible canal hematoma. Disc levels: Degenerative spondylosis throughout the cervical spine, mild to moderate in degree. No more than mild central canal stenosis at any level. Upper chest: No acute findings. Other: None. IMPRESSION: 1. New small focus of acute subdural hemorrhage overlying the anterior margin of the lower RIGHT temporal lobe, measuring 6 mm thickness. Small amount of associated traumatic subarachnoid hemorrhage within the adjacent sulci. No significant mass effect. 2. Additional trace subarachnoid hemorrhage within a sulcus of the superior-lateral LEFT temporal lobe, also without evidence of surrounding parenchymal edema or mass effect. 3. New nondisplaced fracture within the RIGHT frontotemporal bone. 4. New nondisplaced fracture within the RIGHT zygomatic arch. 5. Nondisplaced fracture within the lateral wall of the LEFT orbit, likely acute but stable compared to recent head CT of 01/12/2018. 6. Slightly displaced fracture within the posterior wall of the LEFT maxillary sinus. 7. Scalp edema/hematoma over the LEFT parietooccipital bone, slightly decreased in size compared to the previous study of 01/12/2018. 8. No fracture or acute subluxation within the cervical spine. Critical Value/emergent results were called by telephone at the time of interpretation on 01/14/2018 at 12:45 pm to Dr. Onalee HuaAVID Lianni Kanaan , who verbally acknowledged these results. Electronically Signed   By: Bary RichardStan  Maynard M.D.    On: 01/14/2018 13:00   Ct Cervical Spine Wo Contrast  Result Date: 01/12/2018 CLINICAL DATA:  Patient found face down on floor. Patient states he was looking for is pants. EXAM: CT HEAD WITHOUT CONTRAST CT CERVICAL SPINE WITHOUT CONTRAST TECHNIQUE: Multidetector CT imaging of the head and cervical spine  was performed following the standard protocol without intravenous contrast. Multiplanar CT image reconstructions of the cervical spine were also generated. COMPARISON:  None. FINDINGS: CT HEAD FINDINGS Brain: No evidence of acute infarction, hemorrhage, hydrocephalus, extra-axial collection or mass lesion/mass effect. There is mild diffuse low-attenuation within the subcortical and periventricular white matter compatible with chronic microvascular disease. Prominence of the sulci and ventricles compatible with brain atrophy. Vascular: No hyperdense vessel or unexpected calcification. Skull: Normal. Negative for fracture or focal lesion. Sinuses/Orbits: There is opacification of the right mastoid air cells. Mild mucosal thickening is noted within the left maxillary sinus. The remaining paranasal sinuses and the left mastoid air cells are clear. Other: Left posterior parietal scalp hematoma noted, image 56/5. CT CERVICAL SPINE FINDINGS Alignment: Normal. Skull base and vertebrae: No acute fracture. No primary bone lesion or focal pathologic process. Soft tissues and spinal canal: No prevertebral fluid or swelling. No visible canal hematoma. Disc levels: Ventral endplate spurring and disc space narrowing identified at C3-4 and C5-6. Upper chest: A chronic appearing deformity involving the left shoulder including the distal clavicle and scapula identified. Other: None IMPRESSION: 1. No acute intracranial abnormalities. 2. Left posterior parietal scalp hematoma. 3. Chronic small vessel ischemic change and brain atrophy. 4. Right mastoid air cell opacification. 5. No evidence for cervical spine fracture 6. Cervical  spine degenerative disc disease. Electronically Signed   By: Signa Kell M.D.   On: 01/12/2018 15:23   Ct Pelvis Wo Contrast  Result Date: 01/12/2018 CLINICAL DATA:  Fall today from chair.  Negative plain films. EXAM: CT PELVIS WITHOUT CONTRAST TECHNIQUE: Multidetector CT imaging of the pelvis was performed following the standard protocol without intravenous contrast. COMPARISON:  Pelvis/hip x-rays today. FINDINGS: Urinary Tract:  Within normal. Bowel: Mild diverticulosis of the colon. Moderate segment of sigmoid colon extends into a left inguinal hernia down into the left scrotum. No obstruction. Vascular/Lymphatic: Calcified plaque over the abdominal aorta. No adenopathy. Reproductive:  Unremarkable. Other:  Left inguinal hernia as described above.  No free fluid. Musculoskeletal: No significant degenerative changes of the hips. No acute fracture or dislocation. Minimal degenerate change of the spine. IMPRESSION: No acute fracture. Left inguinal hernia containing a moderate segment of colon which extends down into the left Colonic diverticulosis. Aortic Atherosclerosis (ICD10-I70.0). Scrotum. Electronically Signed   By: Elberta Fortis M.D.   On: 01/12/2018 15:24   Dg Knee Complete 4 Views Left  Result Date: 01/12/2018 CLINICAL DATA:  Fall.  Slid out of a chair.  Initial encounter. EXAM: LEFT KNEE - COMPLETE 4+ VIEW COMPARISON:  None. FINDINGS: There is no evidence of acute fracture, dislocation, or knee joint effusion. No significant arthropathic changes are identified. Atherosclerotic vascular calcifications are noted. IMPRESSION: Negative. Electronically Signed   By: Sebastian Ache M.D.   On: 01/12/2018 11:38   Dg Knee Complete 4 Views Right  Result Date: 01/12/2018 CLINICAL DATA:  Fall from chair EXAM: RIGHT KNEE - COMPLETE 4+ VIEW COMPARISON:  None. FINDINGS: Osseous alignment is normal. No fracture line or displaced fracture fragment seen. No acute or suspicious osseous lesion. No appreciable  joint effusion and adjacent soft tissues are unremarkable. IMPRESSION: Negative. Electronically Signed   By: Bary Richard M.D.   On: 01/12/2018 11:43   Dg Hips Bilat W Or Wo Pelvis 2 Views  Result Date: 01/12/2018 CLINICAL DATA:  Fall from chair. EXAM: DG HIP (WITH OR WITHOUT PELVIS) 2V BILAT COMPARISON:  None. FINDINGS: Osseous alignment is normal. No fracture line or displaced fracture fragment  seen. No degenerative change at either hip joint. Bowel is noted within the scrotal sac, consistent with large hernia. Soft tissues about the pelvis and hips are otherwise unremarkable. IMPRESSION: 1. No osseous fracture or dislocation. 2. Herniated bowel within the scrotal sac. Electronically Signed   By: Bary Richard M.D.   On: 01/12/2018 11:40        Discharge Exam: Vitals:   01/14/18 2142 01/15/18 0547  BP: 117/75 (!) 145/81  Pulse: 96 (!) 107  Resp: 20 20  Temp: 98.2 F (36.8 C) 97.6 F (36.4 C)  SpO2: 96% 95%   Vitals:   01/14/18 1100 01/14/18 1443 01/14/18 2142 01/15/18 0547  BP: (!) 126/105 (!) 152/85 117/75 (!) 145/81  Pulse: (!) 112 95 96 (!) 107  Resp:  19 20 20   Temp:  98.2 F (36.8 C) 98.2 F (36.8 C) 97.6 F (36.4 C)  TempSrc:  Oral Axillary Axillary  SpO2: 99% 97% 96% 95%  Weight:      Height:        General: Pt is alert, awake, not in acute distress Cardiovascular: RRR, S1/S2 +, no rubs, no gallops Respiratory: CTA bilaterally, no wheezing, no rhonchi Abdominal: Soft, NT, ND, bowel sounds + Extremities: no edema, no cyanosis   The results of significant diagnostics from this hospitalization (including imaging, microbiology, ancillary and laboratory) are listed below for reference.    Significant Diagnostic Studies: Dg Chest 1 View  Result Date: 01/12/2018 CLINICAL DATA:  Fall from chair. EXAM: CHEST  1 VIEW COMPARISON:  Chest x-ray dated 08/12/2013. FINDINGS: Heart size and mediastinal contours are within normal limits. Lungs are clear. No pleural effusion  or pneumothorax seen. Osseous structures about the chest are unremarkable. IMPRESSION: No acute findings.  Lungs are clear. Electronically Signed   By: Bary Richard M.D.   On: 01/12/2018 11:44   Ct Head Wo Contrast  Result Date: 01/14/2018 CLINICAL DATA:  Ataxia post head trauma. Found down. EXAM: CT HEAD WITHOUT CONTRAST CT CERVICAL SPINE WITHOUT CONTRAST TECHNIQUE: Multidetector CT imaging of the head and cervical spine was performed following the standard protocol without intravenous contrast. Multiplanar CT image reconstructions of the cervical spine were also generated. COMPARISON:  Head CT dated 01/12/2018. FINDINGS: CT HEAD FINDINGS Brain: New small focus of acute subdural hemorrhage overlying the anterior margin of the lower RIGHT temporal lobe, measuring 6 mm thickness. Small amount of associated traumatic subarachnoid hemorrhage within the adjacent sulci. No appreciable edema within the surrounding parenchyma. No significant mass effect. Additional trace subarachnoid hemorrhage within the sulcus of the superior-lateral LEFT temporal lobe, also without evidence of surrounding parenchymal edema or mass effect. There is generalized age related parenchymal volume loss with commensurate dilatation of the ventricles and sulci. Ventricles are stable in size and configuration. Chronic small vessel ischemic changes again noted within the deep periventricular white matter regions bilaterally. Vascular: Chronic calcified atherosclerotic changes of the large vessels at the skull base. No unexpected hyperdense vessel. Skull: New nondisplaced fracture within the RIGHT frontotemporal bone. Additional new nondisplaced fracture within the RIGHT zygomatic arch. Additional nondisplaced fracture within the lateral wall of the LEFT orbit, stable appearance compared to the most recent head CT of 01/12/2018. Stable comminuted fracture of the LEFT zygomatic arch. Sinuses/Orbits: Fluid/mucosal thickening within the posterior  ethmoid air cells. Orbital globes appear intact and symmetric in configuration. No retro-orbital hemorrhage. Other: Scalp edema/hematoma overlying the LEFT parietooccipital bone has slightly decreased in size. Incidental note made of a slightly displaced fracture within the posterior  wall of the LEFT maxillary sinus, stable compared to the recent head CT. CT CERVICAL SPINE FINDINGS Alignment: Stable dextroscoliosis. No evidence of acute vertebral body subluxation. Skull base and vertebrae: Characterization of osseous detail is slightly limited by patient motion artifact, however, there is no fracture line or displaced fracture fragment seen. Facet joints appear normally aligned. Soft tissues and spinal canal: No prevertebral fluid or swelling. No visible canal hematoma. Disc levels: Degenerative spondylosis throughout the cervical spine, mild to moderate in degree. No more than mild central canal stenosis at any level. Upper chest: No acute findings. Other: None. IMPRESSION: 1. New small focus of acute subdural hemorrhage overlying the anterior margin of the lower RIGHT temporal lobe, measuring 6 mm thickness. Small amount of associated traumatic subarachnoid hemorrhage within the adjacent sulci. No significant mass effect. 2. Additional trace subarachnoid hemorrhage within a sulcus of the superior-lateral LEFT temporal lobe, also without evidence of surrounding parenchymal edema or mass effect. 3. New nondisplaced fracture within the RIGHT frontotemporal bone. 4. New nondisplaced fracture within the RIGHT zygomatic arch. 5. Nondisplaced fracture within the lateral wall of the LEFT orbit, likely acute but stable compared to recent head CT of 01/12/2018. 6. Slightly displaced fracture within the posterior wall of the LEFT maxillary sinus. 7. Scalp edema/hematoma over the LEFT parietooccipital bone, slightly decreased in size compared to the previous study of 01/12/2018. 8. No fracture or acute subluxation within the  cervical spine. Critical Value/emergent results were called by telephone at the time of interpretation on 01/14/2018 at 12:45 pm to Dr. Onalee Hua Dalani Mette , who verbally acknowledged these results. Electronically Signed   By: Bary Richard M.D.   On: 01/14/2018 13:00   Ct Head Wo Contrast  Result Date: 01/12/2018 CLINICAL DATA:  Patient found face down on floor. Patient states he was looking for is pants. EXAM: CT HEAD WITHOUT CONTRAST CT CERVICAL SPINE WITHOUT CONTRAST TECHNIQUE: Multidetector CT imaging of the head and cervical spine was performed following the standard protocol without intravenous contrast. Multiplanar CT image reconstructions of the cervical spine were also generated. COMPARISON:  None. FINDINGS: CT HEAD FINDINGS Brain: No evidence of acute infarction, hemorrhage, hydrocephalus, extra-axial collection or mass lesion/mass effect. There is mild diffuse low-attenuation within the subcortical and periventricular white matter compatible with chronic microvascular disease. Prominence of the sulci and ventricles compatible with brain atrophy. Vascular: No hyperdense vessel or unexpected calcification. Skull: Normal. Negative for fracture or focal lesion. Sinuses/Orbits: There is opacification of the right mastoid air cells. Mild mucosal thickening is noted within the left maxillary sinus. The remaining paranasal sinuses and the left mastoid air cells are clear. Other: Left posterior parietal scalp hematoma noted, image 56/5. CT CERVICAL SPINE FINDINGS Alignment: Normal. Skull base and vertebrae: No acute fracture. No primary bone lesion or focal pathologic process. Soft tissues and spinal canal: No prevertebral fluid or swelling. No visible canal hematoma. Disc levels: Ventral endplate spurring and disc space narrowing identified at C3-4 and C5-6. Upper chest: A chronic appearing deformity involving the left shoulder including the distal clavicle and scapula identified. Other: None IMPRESSION: 1. No acute  intracranial abnormalities. 2. Left posterior parietal scalp hematoma. 3. Chronic small vessel ischemic change and brain atrophy. 4. Right mastoid air cell opacification. 5. No evidence for cervical spine fracture 6. Cervical spine degenerative disc disease. Electronically Signed   By: Signa Kell M.D.   On: 01/12/2018 15:23   Ct Cervical Spine Wo Contrast  Result Date: 01/14/2018 CLINICAL DATA:  Ataxia post head trauma.  Found down. EXAM: CT HEAD WITHOUT CONTRAST CT CERVICAL SPINE WITHOUT CONTRAST TECHNIQUE: Multidetector CT imaging of the head and cervical spine was performed following the standard protocol without intravenous contrast. Multiplanar CT image reconstructions of the cervical spine were also generated. COMPARISON:  Head CT dated 01/12/2018. FINDINGS: CT HEAD FINDINGS Brain: New small focus of acute subdural hemorrhage overlying the anterior margin of the lower RIGHT temporal lobe, measuring 6 mm thickness. Small amount of associated traumatic subarachnoid hemorrhage within the adjacent sulci. No appreciable edema within the surrounding parenchyma. No significant mass effect. Additional trace subarachnoid hemorrhage within the sulcus of the superior-lateral LEFT temporal lobe, also without evidence of surrounding parenchymal edema or mass effect. There is generalized age related parenchymal volume loss with commensurate dilatation of the ventricles and sulci. Ventricles are stable in size and configuration. Chronic small vessel ischemic changes again noted within the deep periventricular white matter regions bilaterally. Vascular: Chronic calcified atherosclerotic changes of the large vessels at the skull base. No unexpected hyperdense vessel. Skull: New nondisplaced fracture within the RIGHT frontotemporal bone. Additional new nondisplaced fracture within the RIGHT zygomatic arch. Additional nondisplaced fracture within the lateral wall of the LEFT orbit, stable appearance compared to the most  recent head CT of 01/12/2018. Stable comminuted fracture of the LEFT zygomatic arch. Sinuses/Orbits: Fluid/mucosal thickening within the posterior ethmoid air cells. Orbital globes appear intact and symmetric in configuration. No retro-orbital hemorrhage. Other: Scalp edema/hematoma overlying the LEFT parietooccipital bone has slightly decreased in size. Incidental note made of a slightly displaced fracture within the posterior wall of the LEFT maxillary sinus, stable compared to the recent head CT. CT CERVICAL SPINE FINDINGS Alignment: Stable dextroscoliosis. No evidence of acute vertebral body subluxation. Skull base and vertebrae: Characterization of osseous detail is slightly limited by patient motion artifact, however, there is no fracture line or displaced fracture fragment seen. Facet joints appear normally aligned. Soft tissues and spinal canal: No prevertebral fluid or swelling. No visible canal hematoma. Disc levels: Degenerative spondylosis throughout the cervical spine, mild to moderate in degree. No more than mild central canal stenosis at any level. Upper chest: No acute findings. Other: None. IMPRESSION: 1. New small focus of acute subdural hemorrhage overlying the anterior margin of the lower RIGHT temporal lobe, measuring 6 mm thickness. Small amount of associated traumatic subarachnoid hemorrhage within the adjacent sulci. No significant mass effect. 2. Additional trace subarachnoid hemorrhage within a sulcus of the superior-lateral LEFT temporal lobe, also without evidence of surrounding parenchymal edema or mass effect. 3. New nondisplaced fracture within the RIGHT frontotemporal bone. 4. New nondisplaced fracture within the RIGHT zygomatic arch. 5. Nondisplaced fracture within the lateral wall of the LEFT orbit, likely acute but stable compared to recent head CT of 01/12/2018. 6. Slightly displaced fracture within the posterior wall of the LEFT maxillary sinus. 7. Scalp edema/hematoma over the  LEFT parietooccipital bone, slightly decreased in size compared to the previous study of 01/12/2018. 8. No fracture or acute subluxation within the cervical spine. Critical Value/emergent results were called by telephone at the time of interpretation on 01/14/2018 at 12:45 pm to Dr. Onalee Hua Jonathin Heinicke , who verbally acknowledged these results. Electronically Signed   By: Bary Richard M.D.   On: 01/14/2018 13:00   Ct Cervical Spine Wo Contrast  Result Date: 01/12/2018 CLINICAL DATA:  Patient found face down on floor. Patient states he was looking for is pants. EXAM: CT HEAD WITHOUT CONTRAST CT CERVICAL SPINE WITHOUT CONTRAST TECHNIQUE: Multidetector CT imaging of the head  and cervical spine was performed following the standard protocol without intravenous contrast. Multiplanar CT image reconstructions of the cervical spine were also generated. COMPARISON:  None. FINDINGS: CT HEAD FINDINGS Brain: No evidence of acute infarction, hemorrhage, hydrocephalus, extra-axial collection or mass lesion/mass effect. There is mild diffuse low-attenuation within the subcortical and periventricular white matter compatible with chronic microvascular disease. Prominence of the sulci and ventricles compatible with brain atrophy. Vascular: No hyperdense vessel or unexpected calcification. Skull: Normal. Negative for fracture or focal lesion. Sinuses/Orbits: There is opacification of the right mastoid air cells. Mild mucosal thickening is noted within the left maxillary sinus. The remaining paranasal sinuses and the left mastoid air cells are clear. Other: Left posterior parietal scalp hematoma noted, image 56/5. CT CERVICAL SPINE FINDINGS Alignment: Normal. Skull base and vertebrae: No acute fracture. No primary bone lesion or focal pathologic process. Soft tissues and spinal canal: No prevertebral fluid or swelling. No visible canal hematoma. Disc levels: Ventral endplate spurring and disc space narrowing identified at C3-4 and C5-6. Upper  chest: A chronic appearing deformity involving the left shoulder including the distal clavicle and scapula identified. Other: None IMPRESSION: 1. No acute intracranial abnormalities. 2. Left posterior parietal scalp hematoma. 3. Chronic small vessel ischemic change and brain atrophy. 4. Right mastoid air cell opacification. 5. No evidence for cervical spine fracture 6. Cervical spine degenerative disc disease. Electronically Signed   By: Signa Kell M.D.   On: 01/12/2018 15:23   Ct Pelvis Wo Contrast  Result Date: 01/12/2018 CLINICAL DATA:  Fall today from chair.  Negative plain films. EXAM: CT PELVIS WITHOUT CONTRAST TECHNIQUE: Multidetector CT imaging of the pelvis was performed following the standard protocol without intravenous contrast. COMPARISON:  Pelvis/hip x-rays today. FINDINGS: Urinary Tract:  Within normal. Bowel: Mild diverticulosis of the colon. Moderate segment of sigmoid colon extends into a left inguinal hernia down into the left scrotum. No obstruction. Vascular/Lymphatic: Calcified plaque over the abdominal aorta. No adenopathy. Reproductive:  Unremarkable. Other:  Left inguinal hernia as described above.  No free fluid. Musculoskeletal: No significant degenerative changes of the hips. No acute fracture or dislocation. Minimal degenerate change of the spine. IMPRESSION: No acute fracture. Left inguinal hernia containing a moderate segment of colon which extends down into the left Colonic diverticulosis. Aortic Atherosclerosis (ICD10-I70.0). Scrotum. Electronically Signed   By: Elberta Fortis M.D.   On: 01/12/2018 15:24   Dg Knee Complete 4 Views Left  Result Date: 01/12/2018 CLINICAL DATA:  Fall.  Slid out of a chair.  Initial encounter. EXAM: LEFT KNEE - COMPLETE 4+ VIEW COMPARISON:  None. FINDINGS: There is no evidence of acute fracture, dislocation, or knee joint effusion. No significant arthropathic changes are identified. Atherosclerotic vascular calcifications are noted.  IMPRESSION: Negative. Electronically Signed   By: Sebastian Ache M.D.   On: 01/12/2018 11:38   Dg Knee Complete 4 Views Right  Result Date: 01/12/2018 CLINICAL DATA:  Fall from chair EXAM: RIGHT KNEE - COMPLETE 4+ VIEW COMPARISON:  None. FINDINGS: Osseous alignment is normal. No fracture line or displaced fracture fragment seen. No acute or suspicious osseous lesion. No appreciable joint effusion and adjacent soft tissues are unremarkable. IMPRESSION: Negative. Electronically Signed   By: Bary Richard M.D.   On: 01/12/2018 11:43   Dg Hips Bilat W Or Wo Pelvis 2 Views  Result Date: 01/12/2018 CLINICAL DATA:  Fall from chair. EXAM: DG HIP (WITH OR WITHOUT PELVIS) 2V BILAT COMPARISON:  None. FINDINGS: Osseous alignment is normal. No fracture line or  displaced fracture fragment seen. No degenerative change at either hip joint. Bowel is noted within the scrotal sac, consistent with large hernia. Soft tissues about the pelvis and hips are otherwise unremarkable. IMPRESSION: 1. No osseous fracture or dislocation. 2. Herniated bowel within the scrotal sac. Electronically Signed   By: Bary Richard M.D.   On: 01/12/2018 11:40     Microbiology: Recent Results (from the past 240 hour(s))  MRSA PCR Screening     Status: None   Collection Time: 01/12/18  9:16 PM  Result Value Ref Range Status   MRSA by PCR NEGATIVE NEGATIVE Final    Comment:        The GeneXpert MRSA Assay (FDA approved for NASAL specimens only), is one component of a comprehensive MRSA colonization surveillance program. It is not intended to diagnose MRSA infection nor to guide or monitor treatment for MRSA infections. Performed at [facility], [address], [place], [place] [address]      Labs: Basic Metabolic Panel: Recent Labs  Lab 01/12/18 1355 01/13/18 0421 01/14/18 1215 01/15/18 0502  NA 138 139 137 136  K 3.4* 3.7 4.1 3.6  CL 107 110 105 105  CO2 24 24 24 22   GLUCOSE 90 87 101* 119*  BUN 18 17 10 10     CREATININE 0.98 1.08 1.04 0.87  CALCIUM 8.4* 7.9* 8.3* 8.1*  MG  --  1.9 1.9  --    Liver Function Tests: No results for input(s): AST, ALT, ALKPHOS, BILITOT, PROT, ALBUMIN in the last 168 hours. No results for input(s): LIPASE, AMYLASE in the last 168 hours. No results for input(s): AMMONIA in the last 168 hours. CBC: Recent Labs  Lab 01/12/18 1355 01/13/18 0421 01/15/18 0502  WBC 6.6  --  7.9  NEUTROABS 5.4  --   --   HGB 11.0*  --  11.1*  HCT 33.3* 29.3* 33.8*  MCV 92.5  --  89.9  PLT 298  --  319   Cardiac Enzymes: Recent Labs  Lab 01/14/18 1215 01/15/18 0502  CKTOTAL 893* 786*   BNP: Invalid input(s): POCBNP CBG: No results for input(s): GLUCAP in the last 168 hours.  Time coordinating discharge:  36 minutes  Signed:  Catarina Hartshorn, DO [facility] Pager: [phone] 01/15/2018, 11:00 AM

## 2018-01-16 ENCOUNTER — Inpatient Hospital Stay (HOSPITAL_COMMUNITY): Payer: Medicare Other

## 2018-01-16 DIAGNOSIS — Z7189 Other specified counseling: Secondary | ICD-10-CM

## 2018-01-16 DIAGNOSIS — S065X9A Traumatic subdural hemorrhage with loss of consciousness of unspecified duration, initial encounter: Secondary | ICD-10-CM

## 2018-01-16 DIAGNOSIS — I4891 Unspecified atrial fibrillation: Secondary | ICD-10-CM

## 2018-01-16 DIAGNOSIS — I609 Nontraumatic subarachnoid hemorrhage, unspecified: Secondary | ICD-10-CM

## 2018-01-16 DIAGNOSIS — R296 Repeated falls: Principal | ICD-10-CM

## 2018-01-16 DIAGNOSIS — Z515 Encounter for palliative care: Secondary | ICD-10-CM

## 2018-01-16 DIAGNOSIS — J690 Pneumonitis due to inhalation of food and vomit: Secondary | ICD-10-CM

## 2018-01-16 LAB — BASIC METABOLIC PANEL
Anion gap: 13 (ref 5–15)
BUN: 19 mg/dL (ref 8–23)
CALCIUM: 8.1 mg/dL — AB (ref 8.9–10.3)
CO2: 15 mmol/L — ABNORMAL LOW (ref 22–32)
Chloride: 108 mmol/L (ref 98–111)
Creatinine, Ser: 1.26 mg/dL — ABNORMAL HIGH (ref 0.61–1.24)
GFR calc non Af Amer: 57 mL/min — ABNORMAL LOW (ref >60)
Glucose, Bld: 89 mg/dL (ref 70–99)
Potassium: 3.7 mmol/L (ref 3.5–5.1)
Sodium: 136 mmol/L (ref 135–145)

## 2018-01-16 LAB — CBC
HCT: 36.6 % — ABNORMAL LOW (ref 39.0–52.0)
Hemoglobin: 11.8 g/dL — ABNORMAL LOW (ref 13.0–17.0)
MCH: 29.9 pg (ref 26.0–34.0)
MCHC: 32.2 g/dL (ref 30.0–36.0)
MCV: 92.7 fL (ref 80.0–100.0)
Platelets: 326 10*3/uL (ref 150–400)
RBC: 3.95 MIL/uL — AB (ref 4.22–5.81)
RDW: 14.5 % (ref 11.5–15.5)
WBC: 8.9 10*3/uL (ref 4.0–10.5)
nRBC: 0 % (ref 0.0–0.2)

## 2018-01-16 LAB — MRSA PCR SCREENING: MRSA by PCR: NEGATIVE

## 2018-01-16 LAB — CK: Total CK: 397 U/L (ref 49–397)

## 2018-01-16 MED ORDER — POTASSIUM CHLORIDE IN NACL 20-0.9 MEQ/L-% IV SOLN
INTRAVENOUS | Status: AC
  Administered 2018-01-16 – 2018-01-17 (×2): via INTRAVENOUS

## 2018-01-16 MED ORDER — DILTIAZEM LOAD VIA INFUSION
10.0000 mg | Freq: Once | INTRAVENOUS | Status: AC
  Administered 2018-01-16: 10 mg via INTRAVENOUS
  Filled 2018-01-16: qty 10

## 2018-01-16 MED ORDER — FOLIC ACID 5 MG/ML IJ SOLN
1.0000 mg | Freq: Every day | INTRAMUSCULAR | Status: DC
  Administered 2018-01-17 – 2018-01-19 (×3): 1 mg via INTRAVENOUS
  Filled 2018-01-16 (×6): qty 0.2

## 2018-01-16 MED ORDER — ORAL CARE MOUTH RINSE: 15.0000 mL | Freq: Two times a day (BID) | OROMUCOSAL | Status: DC

## 2018-01-16 MED ORDER — PANTOPRAZOLE SODIUM 40 MG IV SOLR
40.0000 mg | INTRAVENOUS | Status: DC
  Administered 2018-01-17 – 2018-01-19 (×3): 40 mg via INTRAVENOUS
  Filled 2018-01-16 (×3): qty 40

## 2018-01-16 MED ORDER — DILTIAZEM HCL-DEXTROSE 100-5 MG/100ML-% IV SOLN (PREMIX)
5.0000 mg/h | INTRAVENOUS | Status: DC
  Administered 2018-01-16: 15 mg/h via INTRAVENOUS
  Administered 2018-01-16: 5 mg/h via INTRAVENOUS
  Administered 2018-01-17: 8 mg/h via INTRAVENOUS
  Administered 2018-01-18 – 2018-01-19 (×4): 10 mg/h via INTRAVENOUS
  Filled 2018-01-16 (×7): qty 100

## 2018-01-16 MED ORDER — SODIUM CHLORIDE 0.9 % IV SOLN
INTRAVENOUS | Status: DC | PRN
  Administered 2018-01-16: 500 mL via INTRAVENOUS

## 2018-01-16 MED ORDER — SODIUM CHLORIDE 0.9 % IV BOLUS
500.0000 mL | Freq: Once | INTRAVENOUS | Status: AC
  Administered 2018-01-16: 500 mL via INTRAVENOUS

## 2018-01-16 MED ORDER — PIPERACILLIN-TAZOBACTAM 3.375 G IVPB
3.3750 g | Freq: Three times a day (TID) | INTRAVENOUS | Status: DC
  Administered 2018-01-16 – 2018-01-19 (×9): 3.375 g via INTRAVENOUS
  Filled 2018-01-16 (×9): qty 50

## 2018-01-16 MED ORDER — HALOPERIDOL LACTATE 5 MG/ML IJ SOLN: 5.0000 mg | Freq: Four times a day (QID) | INTRAMUSCULAR | Status: DC | PRN

## 2018-01-16 NOTE — Progress Notes (Signed)
Patient transferred to ICU-10,report called and given to Peggyann Jubayan Wilkerson RN. Accompanied by staff to awaiting floor.

## 2018-01-16 NOTE — Progress Notes (Addendum)
PROGRESS NOTE  Thomas Foley RUE:[id number] DOB: 04-22-1945 DOA: 01/12/2018 PCP: Avon Gully, MD  Brief History:  72 year old male with history of severe dementia, COPD brought to the ED after unwitnessed fall. He is a resident of a care home. At baseline reportedly he is ambulatory but confused and requiring assistance with all his ADLs. History very limited. In the ED vitals were stable. Blood work was unremarkable. He fell on the floor in the ED. X-rays and head CT negative for any fracture, showed posterior scalp hematoma and chronic small ischemic vessel changes. Patient placed in observation for further management and physical therapy evaluation. During his hospitalization, the patient had intermittent episodes of hospital delirium with agitation. He required Haldol and Ativan. Unfortunately, the patient sustained a mechanical fall on the morning of 01/14/2018. CT of the head showed new small subdural hemorrhage measuring 6 mm in the right temporal lobe. There is a small amount of traumatic subarachnoid hemorrhage in the adjacent sulci. There is a trace subdural hemorrhage in the lateral temporal lobe on the left. There is also a nondisplaced mandibular fracture on the right frontal temporal bone as well as the right zygomatic arch. There was a nondisplaced fracture of the lateral wall of the left orbit and left maxillary sinus. The case was discussed with neurosurgery and ENT trauma. They both recommended conservative, nonoperative management.  Unfortunately, the patient's medical condition declined on 01/16/2018.  The patient was noted to have dyspnea.  Work-up revealed the patient had atrial fibrillation with RVR as well as healthcare associated pneumonia.  The patient was transferred to the ICU.  As expected, his mental status worsened with his clinical decline.  Assessment/Plan: Aspiration pneumonia/healthcare associated pneumonia -Start Zosyn -Personally reviewed  chest x-ray--increasing right >L lower lobe opacity -Reconsulted speech therapy for repeat evaluation  Paroxysmal atrial fibrillation with RVR -Appreciate cardiology consult--> not a good candidate for anticoagulation due to SDH, SAH -Not a candidate for aggressive measures -Focus on heart rate control -CHADVASc = 2 -start diltiazem drip  Subdural hematoma/subarachnoid hemorrhage -01/14/2018 CT brain--subdural hemorrhage and subarachnoid hemorrhage as discussed above -Case was discussed with neurosurgery (Dr. Danielle Dess) and ENT trauma-->recommend nonoperative, conservative management -Discontinue enoxaparin -Place SCDs  Facial fractures/maxillary sinus fracture -Secondary to mechanical fall -Case discussed with ENT trauma (Dr. Thayer Ohm Newman)-->conservative management,nonoperative management  Gait Instability/Frequent Falls -TSH 0.677 -Urinalysis negative for pyuria -Magnesium 1.9 -Serum B12 149 -RBC folate--1896 -CK--893>>>397   Mild rhabdomyolysis-traumatic -CPK 893>>>786>>397 -Started IV fluids -Repeat CPK trending down  B12 deficiency -Supplement IM during hospitalization -Discharged with oral supplementation -Repeat serum B12 in 1 month  Hypokalemia -Repleted -Magnesium 1.9  Cognitive impairment/dementia -Continue olanzapine at bedtime and Cogentin -Haldol as needed agitation -Patient is at risk for hospital delirium -Patient has episodes of impulsiveness-->patient had mechanical fall a.m. 01/14/2018  Dehydration -pt received IVF during the hospitalization  Stage I pressure injury--sacrum -Local wound care -Not infected on exam -Present prior to admission  Goals of care -Palliative medicine has been consulted -The patient is a ward of the state -The patient's overall prognosis is a very poor -I suspect he may not survive this hospitalization -Numerous medical staff and ancillary staff have tried to contact the patient's social worker at social  services of [place]--messages have been left on voicemail without return calls -I have personally contacted Catering manager of [facility] [place] [facility] and left a voicemail -I have yet to receive a return call   Disposition  Plan:  Transfer to stepdown Family Communication:   Multiple attempts to contact the patient's guardian at social services of [facility]  Consultants: Cardiology, palliative medicine  Code Status:  FULL  DVT Prophylaxis:  SCDs   Procedures: As Listed in Progress Note Above  Antibiotics: Zosyn 12/6>>    Subjective: The patient is awake.  He intermittently follows commands.  He denies any chest pain, shortness breath, abdominal pain.  He denies any hip pain.  Questionable history reliability.  There is no history of vomiting, diarrhea.  Objective: Vitals:   01/16/18 1245 01/16/18 1300 01/16/18 1315 01/16/18 1330  BP: (!) 84/59 (!) 86/65  (!) 88/66  Pulse: (!) 104 (!) 34 77 76  Resp: (!) 33 (!) 28 (!) 31 (!) 26  Temp:      TempSrc:      SpO2: (!) 87% 93% 94% 95%  Weight:      Height:        Intake/Output Summary (Last 24 hours) at 01/16/2018 1423 Last data filed at 01/16/2018 0859 Gross per 24 hour  Intake 50 ml  Output 925 ml  Net -875 ml   Weight change:  Exam:   General:  Pt is alert but somnolent.  He follows one-step commands occasionally.  HEENT: No icterus, No thrush, No neck mass, [place]/AT  Cardiovascular: IRRR, S1/S2, no rubs, no gallops  Respiratory: Bibasilar rales, right greater than left.  No wheezing.  Good air movement.  Abdomen: Soft/+BS, non tender, non distended, no guarding  Extremities: No edema, No lymphangitis, No petechiae, No rashes, no synovitis   Data Reviewed: I have personally reviewed following labs and imaging studies Basic Metabolic Panel: Recent Labs  Lab 01/12/18 1355 01/13/18 0421 01/14/18 1215 01/15/18 0502 01/16/18 1033  NA 138 139 137 136 136  K 3.4* 3.7 4.1  3.6 3.7  CL 107 110 105 105 108  CO2 24 24 24 22  15*  GLUCOSE 90 87 101* 119* 89  BUN 18 17 10 10 19   CREATININE 0.98 1.08 1.04 0.87 1.26*  CALCIUM 8.4* 7.9* 8.3* 8.1* 8.1*  MG  --  1.9 1.9  --   --    Liver Function Tests: No results for input(s): AST, ALT, ALKPHOS, BILITOT, PROT, ALBUMIN in the last 168 hours. No results for input(s): LIPASE, AMYLASE in the last 168 hours. No results for input(s): AMMONIA in the last 168 hours. Coagulation Profile: No results for input(s): INR, PROTIME in the last 168 hours. CBC: Recent Labs  Lab 01/12/18 1355 01/13/18 0421 01/15/18 0502 01/16/18 1033  WBC 6.6  --  7.9 8.9  NEUTROABS 5.4  --   --   --   HGB 11.0*  --  11.1* 11.8*  HCT 33.3* 29.3* 33.8* 36.6*  MCV 92.5  --  89.9 92.7  PLT 298  --  319 326   Cardiac Enzymes: Recent Labs  Lab 01/14/18 1215 01/15/18 0502 01/16/18 1033  CKTOTAL 893* 786* 397   BNP: Invalid input(s): POCBNP CBG: No results for input(s): GLUCAP in the last 168 hours. HbA1C: No results for input(s): HGBA1C in the last 72 hours. Urine analysis:    Component Value Date/Time   COLORURINE YELLOW 01/12/2018 1327   APPEARANCEUR CLEAR 01/12/2018 1327   LABSPEC 1.025 01/12/2018 1327   PHURINE 6.0 01/12/2018 1327   GLUCOSEU NEGATIVE 01/12/2018 1327   HGBUR NEGATIVE 01/12/2018 1327   BILIRUBINUR NEGATIVE 01/12/2018 1327   KETONESUR 20 (A) 01/12/2018 1327   PROTEINUR NEGATIVE 01/12/2018 1327   NITRITE  NEGATIVE 01/12/2018 1327   LEUKOCYTESUR NEGATIVE 01/12/2018 1327   Sepsis Labs: @LABRCNTIP (procalcitonin:4,lacticidven:4) ) Recent Results (from the past 240 hour(s))  MRSA PCR Screening     Status: None   Collection Time: 01/12/18  9:16 PM  Result Value Ref Range Status   MRSA by PCR NEGATIVE NEGATIVE Final    Comment:        The GeneXpert MRSA Assay (FDA approved for NASAL specimens only), is one component of a comprehensive MRSA colonization surveillance program. It is not intended to diagnose  MRSA infection nor to guide or monitor treatment for MRSA infections. Performed at [facility], [address], [place], [place] [address]      Scheduled Meds: . benztropine  1 mg Oral Daily  . cyanocobalamin  1,000 mcg Intramuscular Daily  . folic acid  1 mg Oral Daily  . metoprolol tartrate  12.5 mg Oral BID  . OLANZapine  5 mg Oral QHS  . pantoprazole  40 mg Oral Daily   Continuous Infusions: . diltiazem (CARDIZEM) infusion 15 mg/hr (01/16/18 1244)  . piperacillin-tazobactam (ZOSYN)  IV      Procedures/Studies: Dg Chest 1 View  Result Date: 01/12/2018 CLINICAL DATA:  Fall from chair. EXAM: CHEST  1 VIEW COMPARISON:  Chest x-ray dated 08/12/2013. FINDINGS: Heart size and mediastinal contours are within normal limits. Lungs are clear. No pleural effusion or pneumothorax seen. Osseous structures about the chest are unremarkable. IMPRESSION: No acute findings.  Lungs are clear. Electronically Signed   By: Bary Richard M.D.   On: 01/12/2018 11:44   Ct Head Wo Contrast  Result Date: 01/14/2018 CLINICAL DATA:  Ataxia post head trauma. Found down. EXAM: CT HEAD WITHOUT CONTRAST CT CERVICAL SPINE WITHOUT CONTRAST TECHNIQUE: Multidetector CT imaging of the head and cervical spine was performed following the standard protocol without intravenous contrast. Multiplanar CT image reconstructions of the cervical spine were also generated. COMPARISON:  Head CT dated 01/12/2018. FINDINGS: CT HEAD FINDINGS Brain: New small focus of acute subdural hemorrhage overlying the anterior margin of the lower RIGHT temporal lobe, measuring 6 mm thickness. Small amount of associated traumatic subarachnoid hemorrhage within the adjacent sulci. No appreciable edema within the surrounding parenchyma. No significant mass effect. Additional trace subarachnoid hemorrhage within the sulcus of the superior-lateral LEFT temporal lobe, also without evidence of surrounding parenchymal edema or mass effect. There is  generalized age related parenchymal volume loss with commensurate dilatation of the ventricles and sulci. Ventricles are stable in size and configuration. Chronic small vessel ischemic changes again noted within the deep periventricular white matter regions bilaterally. Vascular: Chronic calcified atherosclerotic changes of the large vessels at the skull base. No unexpected hyperdense vessel. Skull: New nondisplaced fracture within the RIGHT frontotemporal bone. Additional new nondisplaced fracture within the RIGHT zygomatic arch. Additional nondisplaced fracture within the lateral wall of the LEFT orbit, stable appearance compared to the most recent head CT of 01/12/2018. Stable comminuted fracture of the LEFT zygomatic arch. Sinuses/Orbits: Fluid/mucosal thickening within the posterior ethmoid air cells. Orbital globes appear intact and symmetric in configuration. No retro-orbital hemorrhage. Other: Scalp edema/hematoma overlying the LEFT parietooccipital bone has slightly decreased in size. Incidental note made of a slightly displaced fracture within the posterior wall of the LEFT maxillary sinus, stable compared to the recent head CT. CT CERVICAL SPINE FINDINGS Alignment: Stable dextroscoliosis. No evidence of acute vertebral body subluxation. Skull base and vertebrae: Characterization of osseous detail is slightly limited by patient motion artifact, however, there is no fracture line or  displaced fracture fragment seen. Facet joints appear normally aligned. Soft tissues and spinal canal: No prevertebral fluid or swelling. No visible canal hematoma. Disc levels: Degenerative spondylosis throughout the cervical spine, mild to moderate in degree. No more than mild central canal stenosis at any level. Upper chest: No acute findings. Other: None. IMPRESSION: 1. New small focus of acute subdural hemorrhage overlying the anterior margin of the lower RIGHT temporal lobe, measuring 6 mm thickness. Small amount of  associated traumatic subarachnoid hemorrhage within the adjacent sulci. No significant mass effect. 2. Additional trace subarachnoid hemorrhage within a sulcus of the superior-lateral LEFT temporal lobe, also without evidence of surrounding parenchymal edema or mass effect. 3. New nondisplaced fracture within the RIGHT frontotemporal bone. 4. New nondisplaced fracture within the RIGHT zygomatic arch. 5. Nondisplaced fracture within the lateral wall of the LEFT orbit, likely acute but stable compared to recent head CT of 01/12/2018. 6. Slightly displaced fracture within the posterior wall of the LEFT maxillary sinus. 7. Scalp edema/hematoma over the LEFT parietooccipital bone, slightly decreased in size compared to the previous study of 01/12/2018. 8. No fracture or acute subluxation within the cervical spine. Critical Value/emergent results were called by telephone at the time of interpretation on 01/14/2018 at 12:45 pm to Dr. Onalee HuaAVID Faysal Fenoglio , who verbally acknowledged these results. Electronically Signed   By: Bary RichardStan  Maynard M.D.   On: 01/14/2018 13:00   Ct Head Wo Contrast  Result Date: 01/12/2018 CLINICAL DATA:  Patient found face down on floor. Patient states he was looking for is pants. EXAM: CT HEAD WITHOUT CONTRAST CT CERVICAL SPINE WITHOUT CONTRAST TECHNIQUE: Multidetector CT imaging of the head and cervical spine was performed following the standard protocol without intravenous contrast. Multiplanar CT image reconstructions of the cervical spine were also generated. COMPARISON:  None. FINDINGS: CT HEAD FINDINGS Brain: No evidence of acute infarction, hemorrhage, hydrocephalus, extra-axial collection or mass lesion/mass effect. There is mild diffuse low-attenuation within the subcortical and periventricular white matter compatible with chronic microvascular disease. Prominence of the sulci and ventricles compatible with brain atrophy. Vascular: No hyperdense vessel or unexpected calcification. Skull: Normal.  Negative for fracture or focal lesion. Sinuses/Orbits: There is opacification of the right mastoid air cells. Mild mucosal thickening is noted within the left maxillary sinus. The remaining paranasal sinuses and the left mastoid air cells are clear. Other: Left posterior parietal scalp hematoma noted, image 56/5. CT CERVICAL SPINE FINDINGS Alignment: Normal. Skull base and vertebrae: No acute fracture. No primary bone lesion or focal pathologic process. Soft tissues and spinal canal: No prevertebral fluid or swelling. No visible canal hematoma. Disc levels: Ventral endplate spurring and disc space narrowing identified at C3-4 and C5-6. Upper chest: A chronic appearing deformity involving the left shoulder including the distal clavicle and scapula identified. Other: None IMPRESSION: 1. No acute intracranial abnormalities. 2. Left posterior parietal scalp hematoma. 3. Chronic small vessel ischemic change and brain atrophy. 4. Right mastoid air cell opacification. 5. No evidence for cervical spine fracture 6. Cervical spine degenerative disc disease. Electronically Signed   By: Signa Kellaylor  Stroud M.D.   On: 01/12/2018 15:23   Ct Cervical Spine Wo Contrast  Result Date: 01/14/2018 CLINICAL DATA:  Ataxia post head trauma. Found down. EXAM: CT HEAD WITHOUT CONTRAST CT CERVICAL SPINE WITHOUT CONTRAST TECHNIQUE: Multidetector CT imaging of the head and cervical spine was performed following the standard protocol without intravenous contrast. Multiplanar CT image reconstructions of the cervical spine were also generated. COMPARISON:  Head CT dated 01/12/2018. FINDINGS:  CT HEAD FINDINGS Brain: New small focus of acute subdural hemorrhage overlying the anterior margin of the lower RIGHT temporal lobe, measuring 6 mm thickness. Small amount of associated traumatic subarachnoid hemorrhage within the adjacent sulci. No appreciable edema within the surrounding parenchyma. No significant mass effect. Additional trace subarachnoid  hemorrhage within the sulcus of the superior-lateral LEFT temporal lobe, also without evidence of surrounding parenchymal edema or mass effect. There is generalized age related parenchymal volume loss with commensurate dilatation of the ventricles and sulci. Ventricles are stable in size and configuration. Chronic small vessel ischemic changes again noted within the deep periventricular white matter regions bilaterally. Vascular: Chronic calcified atherosclerotic changes of the large vessels at the skull base. No unexpected hyperdense vessel. Skull: New nondisplaced fracture within the RIGHT frontotemporal bone. Additional new nondisplaced fracture within the RIGHT zygomatic arch. Additional nondisplaced fracture within the lateral wall of the LEFT orbit, stable appearance compared to the most recent head CT of 01/12/2018. Stable comminuted fracture of the LEFT zygomatic arch. Sinuses/Orbits: Fluid/mucosal thickening within the posterior ethmoid air cells. Orbital globes appear intact and symmetric in configuration. No retro-orbital hemorrhage. Other: Scalp edema/hematoma overlying the LEFT parietooccipital bone has slightly decreased in size. Incidental note made of a slightly displaced fracture within the posterior wall of the LEFT maxillary sinus, stable compared to the recent head CT. CT CERVICAL SPINE FINDINGS Alignment: Stable dextroscoliosis. No evidence of acute vertebral body subluxation. Skull base and vertebrae: Characterization of osseous detail is slightly limited by patient motion artifact, however, there is no fracture line or displaced fracture fragment seen. Facet joints appear normally aligned. Soft tissues and spinal canal: No prevertebral fluid or swelling. No visible canal hematoma. Disc levels: Degenerative spondylosis throughout the cervical spine, mild to moderate in degree. No more than mild central canal stenosis at any level. Upper chest: No acute findings. Other: None. IMPRESSION: 1. New  small focus of acute subdural hemorrhage overlying the anterior margin of the lower RIGHT temporal lobe, measuring 6 mm thickness. Small amount of associated traumatic subarachnoid hemorrhage within the adjacent sulci. No significant mass effect. 2. Additional trace subarachnoid hemorrhage within a sulcus of the superior-lateral LEFT temporal lobe, also without evidence of surrounding parenchymal edema or mass effect. 3. New nondisplaced fracture within the RIGHT frontotemporal bone. 4. New nondisplaced fracture within the RIGHT zygomatic arch. 5. Nondisplaced fracture within the lateral wall of the LEFT orbit, likely acute but stable compared to recent head CT of 01/12/2018. 6. Slightly displaced fracture within the posterior wall of the LEFT maxillary sinus. 7. Scalp edema/hematoma over the LEFT parietooccipital bone, slightly decreased in size compared to the previous study of 01/12/2018. 8. No fracture or acute subluxation within the cervical spine. Critical Value/emergent results were called by telephone at the time of interpretation on 01/14/2018 at 12:45 pm to Dr. Onalee Hua Jeena Arnett , who verbally acknowledged these results. Electronically Signed   By: Bary Richard M.D.   On: 01/14/2018 13:00   Ct Cervical Spine Wo Contrast  Result Date: 01/12/2018 CLINICAL DATA:  Patient found face down on floor. Patient states he was looking for is pants. EXAM: CT HEAD WITHOUT CONTRAST CT CERVICAL SPINE WITHOUT CONTRAST TECHNIQUE: Multidetector CT imaging of the head and cervical spine was performed following the standard protocol without intravenous contrast. Multiplanar CT image reconstructions of the cervical spine were also generated. COMPARISON:  None. FINDINGS: CT HEAD FINDINGS Brain: No evidence of acute infarction, hemorrhage, hydrocephalus, extra-axial collection or mass lesion/mass effect. There is mild diffuse low-attenuation  within the subcortical and periventricular white matter compatible with chronic microvascular  disease. Prominence of the sulci and ventricles compatible with brain atrophy. Vascular: No hyperdense vessel or unexpected calcification. Skull: Normal. Negative for fracture or focal lesion. Sinuses/Orbits: There is opacification of the right mastoid air cells. Mild mucosal thickening is noted within the left maxillary sinus. The remaining paranasal sinuses and the left mastoid air cells are clear. Other: Left posterior parietal scalp hematoma noted, image 56/5. CT CERVICAL SPINE FINDINGS Alignment: Normal. Skull base and vertebrae: No acute fracture. No primary bone lesion or focal pathologic process. Soft tissues and spinal canal: No prevertebral fluid or swelling. No visible canal hematoma. Disc levels: Ventral endplate spurring and disc space narrowing identified at C3-4 and C5-6. Upper chest: A chronic appearing deformity involving the left shoulder including the distal clavicle and scapula identified. Other: None IMPRESSION: 1. No acute intracranial abnormalities. 2. Left posterior parietal scalp hematoma. 3. Chronic small vessel ischemic change and brain atrophy. 4. Right mastoid air cell opacification. 5. No evidence for cervical spine fracture 6. Cervical spine degenerative disc disease. Electronically Signed   By: Signa Kell M.D.   On: 01/12/2018 15:23   Ct Pelvis Wo Contrast  Result Date: 01/12/2018 CLINICAL DATA:  Fall today from chair.  Negative plain films. EXAM: CT PELVIS WITHOUT CONTRAST TECHNIQUE: Multidetector CT imaging of the pelvis was performed following the standard protocol without intravenous contrast. COMPARISON:  Pelvis/hip x-rays today. FINDINGS: Urinary Tract:  Within normal. Bowel: Mild diverticulosis of the colon. Moderate segment of sigmoid colon extends into a left inguinal hernia down into the left scrotum. No obstruction. Vascular/Lymphatic: Calcified plaque over the abdominal aorta. No adenopathy. Reproductive:  Unremarkable. Other:  Left inguinal hernia as described  above.  No free fluid. Musculoskeletal: No significant degenerative changes of the hips. No acute fracture or dislocation. Minimal degenerate change of the spine. IMPRESSION: No acute fracture. Left inguinal hernia containing a moderate segment of colon which extends down into the left Colonic diverticulosis. Aortic Atherosclerosis (ICD10-I70.0). Scrotum. Electronically Signed   By: Elberta Fortis M.D.   On: 01/12/2018 15:24   Dg Chest Port 1 View  Result Date: 01/16/2018 CLINICAL DATA:  Increased shortness of breath. Low-grade fever. Irregular heart rate. EXAM: PORTABLE CHEST 1 VIEW COMPARISON:  Chest x-rays dated 01/12/2018 and 08/12/2013. FINDINGS: Heart size and mediastinal contours are within normal limits. Patchy opacities are seen bilaterally, particularly prominent at the medial aspects of the RIGHT lung base. Coarse interstitial markings bilaterally suggesting interstitial edema. No pleural effusions seen. No pneumothorax seen. Chronic appearing deformity of the distal LEFT clavicle. No acute appearing osseous abnormality. IMPRESSION: 1. Patchy opacities bilaterally, particularly prominent at the medial aspects of the RIGHT lung base, concerning for multifocal pneumonia. 2. Coarse interstitial markings suggesting associated interstitial edema. Electronically Signed   By: Bary Richard M.D.   On: 01/16/2018 11:09   Dg Knee Complete 4 Views Left  Result Date: 01/12/2018 CLINICAL DATA:  Fall.  Slid out of a chair.  Initial encounter. EXAM: LEFT KNEE - COMPLETE 4+ VIEW COMPARISON:  None. FINDINGS: There is no evidence of acute fracture, dislocation, or knee joint effusion. No significant arthropathic changes are identified. Atherosclerotic vascular calcifications are noted. IMPRESSION: Negative. Electronically Signed   By: Sebastian Ache M.D.   On: 01/12/2018 11:38   Dg Knee Complete 4 Views Right  Result Date: 01/12/2018 CLINICAL DATA:  Fall from chair EXAM: RIGHT KNEE - COMPLETE 4+ VIEW COMPARISON:   None. FINDINGS: Osseous alignment is  normal. No fracture line or displaced fracture fragment seen. No acute or suspicious osseous lesion. No appreciable joint effusion and adjacent soft tissues are unremarkable. IMPRESSION: Negative. Electronically Signed   By: Bary Richard M.D.   On: 01/12/2018 11:43   Dg Hips Bilat W Or Wo Pelvis 2 Views  Result Date: 01/12/2018 CLINICAL DATA:  Fall from chair. EXAM: DG HIP (WITH OR WITHOUT PELVIS) 2V BILAT COMPARISON:  None. FINDINGS: Osseous alignment is normal. No fracture line or displaced fracture fragment seen. No degenerative change at either hip joint. Bowel is noted within the scrotal sac, consistent with large hernia. Soft tissues about the pelvis and hips are otherwise unremarkable. IMPRESSION: 1. No osseous fracture or dislocation. 2. Herniated bowel within the scrotal sac. Electronically Signed   By: Bary Richard M.D.   On: 01/12/2018 11:40    Catarina Hartshorn, DO  [facility] Pager [phone]  If 7PM-7AM, please contact night-coverage [email or web address] Password [email or web address] 01/16/2018, 2:23 PM   LOS: 2 days

## 2018-01-16 NOTE — Progress Notes (Signed)
Patient placed on Telemetry box 18. Hr rate in the 150's,Dr Tat notified. Will continue to monitor patient.

## 2018-01-16 NOTE — Progress Notes (Signed)
Again attempted to reach DSS legal guardian, Alisia FerrariBailey Sively to discuss goals of care. Voicemail left.   NO CHARGE  Vennie HomansMegan Jentzen Minasyan, FNP-C Palliative Medicine Team  Phone: [phone] Fax: [phone]

## 2018-01-16 NOTE — Progress Notes (Signed)
SLP Cancellation Note  Patient Details Name: Thomas Foley MRN: [id number] DOB: 09/26/1945   Cancelled treatment:        SLP attempted to provide re-assessment of pt's swallowing function this date, however, Pt is not alert and therefore not appropriate for PO at this time. Recommend NPO pending ST- BSE when alertness improves. ST will also continue to follow for goals of care. Thank you,  Amelia H. Romie LeveeYarbrough MA, CCC-SLP Speech Language Pathologist    Georgetta Habermelia H Yarbrough 01/16/2018, 2:35 PM

## 2018-01-16 NOTE — Progress Notes (Signed)
I spoke with DSS, Hiram GashMelinda Spell I updated her regarding patient's declining medical condition and poor prognosis I filled out appropriate paperwork for DNR I faxed back to DSS -They have confirmed that patient can be changed to DNR  Total time 65 min.

## 2018-01-16 NOTE — Consult Note (Addendum)
Cardiology Consult    Patient ID: Lavel Rieman; [id number]; 08/24/1945   Admit date: 01/12/2018 Date of Consult: 01/16/2018  Primary Care Provider: Avon Gully, MD Primary Cardiologist: New to [facility] - Dr. Diona Browner  Patient Profile    Endre Coutts is a 72 y.o. male with past medical history of COPD, GERD, dementia and frequent falls who is being seen today for the evaluation of new-onset atrial fibrillation with RVR at the request of Dr. Arbutus Leas.   History of Present Illness    Mr. Hartis initially presented to [facility] on 01/12/2018 for evaluation of frequent falls. By review of the notes, he was previously ambulatory but did have occasional falls.  The day of admission, he was found to be sitting on the floor and was nonweightbearing. He suffered another fall while in the ED and has since had a sitter at the bedside.    Initial labs showed WBC 6.6, Hgb 11.0, platelets 298, Na+ 138, K+ 3.4, and creatinine 0.98. TSH 0.677. Mg 1.9.  CXR showed no acute cardiopulmonary abnormalities. CT Head showed no acute intracranial abnormalities but was noted to have a left posterior parietal hematoma and chronic small vessel disease.  EKG was not obtained at the time of admission but HR was at 67 by review of the ED provider notes.  On 01/14/2018, he was again found on the floor. Repeat CT imaging of the head showed a new small focus of acute subdural hemorrhage overlying the anterior margin of the lower right temporal lobe, measuring 6 mm this.  Also noted to have a small amount of associated traumatic subarachnoid hemorrhage with no significant mass-effect.  On 01/15/2018, he was noted to be tachycardic and computer generated read of his EKG at that time showed sinus tachycardia but P waves are difficult to distinguish at times and there is significant baseline artifact. Overall, it appears most concerning for atrial fibrillation. Repeat tracing this morning showed atrial fibrillation with RVR, HR 155.    During this encounter, the patient is unable to provide any history. He does mumble throughout the encounter but is unable to produce any clear words. He does not shake his head yes or no to any questions when asked. A sitter is currently at the bedside and reports he has been like this for the past few days and says that he is not been consuming most of his meals.   Past Medical History:  Diagnosis Date  . COPD (chronic obstructive pulmonary disease) (HCC)   . GERD (gastroesophageal reflux disease)     History reviewed. No pertinent surgical history.   Home Medications:  Prior to Admission medications   Medication Sig Start Date End Date Taking? Authorizing Provider  albuterol (PROVENTIL HFA;VENTOLIN HFA) 108 (90 Base) MCG/ACT inhaler Inhale 1-2 puffs into the lungs every 6 (six) hours as needed for wheezing or shortness of breath.   Yes [provider]  benztropine (COGENTIN) 1 MG tablet Take 1 tablet by mouth daily. 08/19/14  Yes [provider]  Calcium Carbonate-Vitamin D (CALCIUM 600+D) 600-400 MG-UNIT tablet Take 1 tablet by mouth 3 (three) times daily with meals.    Yes [provider]  folic acid (FOLVITE) 1 MG tablet Take 1 mg by mouth daily.   Yes [provider]  LORazepam (ATIVAN) 0.5 MG tablet Take 0.5 mg by mouth every 8 (eight) hours as needed for anxiety.   Yes [provider]  Melatonin ([employer] MELATONIN) 3 MG TABS Take 3 mg by mouth at  bedtime.   Yes [provider]  OLANZapine (ZYPREXA) 5 MG tablet Take 1 tablet by mouth at bedtime. 08/19/14  Yes [provider]  Omega-3 1000 MG CAPS Take 1 capsule by mouth 2 (two) times daily.   Yes [provider]  omeprazole (PRILOSEC) 20 MG capsule Take 20 mg by mouth daily.   Yes [provider]  acetaminophen (TYLENOL) 500 MG tablet Take 2 tablets (1,000 mg total) by mouth every 6 (six) hours as needed for mild pain or moderate pain. 01/14/18   Catarina Hartshornat, David, MD   metoprolol tartrate (LOPRESSOR) 25 MG tablet Take 0.5 tablets (12.5 mg total) by mouth 2 (two) times daily. 01/15/18   Catarina Hartshornat, David, MD  vitamin B-12 (CYANOCOBALAMIN) 500 MCG tablet Take 1 tablet (500 mcg total) by mouth daily. 01/14/18   Catarina Hartshornat, David, MD    Inpatient Medications: Scheduled Meds: . benztropine  1 mg Oral Daily  . cyanocobalamin  1,000 mcg Intramuscular Daily  . diltiazem  10 mg Intravenous Once  . folic acid  1 mg Oral Daily  . metoprolol tartrate  12.5 mg Oral BID  . OLANZapine  5 mg Oral QHS  . pantoprazole  40 mg Oral Daily   Continuous Infusions: . diltiazem (CARDIZEM) infusion    . piperacillin-tazobactam (ZOSYN)  IV     PRN Meds: acetaminophen **OR** acetaminophen, haloperidol lactate, ondansetron **OR** ondansetron (ZOFRAN) IV  Allergies:   No Known Allergies  Social History:   Social History   Socioeconomic History  . Marital status: Single    Spouse name: Not on file  . Number of children: Not on file  . Years of education: Not on file  . Highest education level: Not on file  Occupational History  . Not on file  Social Needs  . Financial resource strain: Not on file  . Food insecurity:    Worry: Not on file    Inability: Not on file  . Transportation needs:    Medical: Not on file    Non-medical: Not on file  Tobacco Use  . Smoking status: Current Some Day Smoker  . Smokeless tobacco: Former Engineer, waterUser  Substance and Sexual Activity  . Alcohol use: No  . Drug use: No  . Sexual activity: Not on file  Lifestyle  . Physical activity:    Days per week: Not on file    Minutes per session: Not on file  . Stress: Not on file  Relationships  . Social connections:    Talks on phone: Not on file    Gets together: Not on file    Attends religious service: Not on file    Active member of club or organization: Not on file    Attends meetings of clubs or organizations: Not on file    Relationship status: Not on file  . Intimate partner violence:     Fear of current or ex partner: Not on file    Emotionally abused: Not on file    Physically abused: Not on file    Forced sexual activity: Not on file  Other Topics Concern  . Not on file  Social History Narrative  . Not on file     Family History:   No family history on file. Unable to obtain. Patient not currently answering questions. Has baseline dementia.     Review of Systems    Unable to be obtained given patient's baseline dementia.   Physical Exam/Data    Vitals:   01/15/18 1457 01/15/18 2202  01/16/18 0540 01/16/18 0541  BP: 135/82 134/81 129/88   Pulse: 95 (!) 105 (!) 119 (!) 34  Resp: 17 20 20    Temp: 99.8 F (37.7 C) 98.7 F (37.1 C) 98.4 F (36.9 C)   TempSrc: Oral Oral Oral   SpO2: 94% 90% (!) 87% (!) 86%  Weight:      Height:        Intake/Output Summary (Last 24 hours) at 01/16/2018 1119 Last data filed at 01/16/2018 0546 Gross per 24 hour  Intake -  Output 925 ml  Net -925 ml   Filed Weights   01/12/18 2101  Weight: 60.5 kg   Body mass index is 18.09 kg/m.   General: Elderly, frail Caucasian male.  Psych: Normal affect. Neuro: Unable to assess. Moving extremities spontaneously.  HEENT: Multiple abrasions noted with dressings and bandages in place.   Neck: Supple without bruits or JVD. Lungs:  Resp regular and unlabored, decreased along bases bilaterally.  Heart: Tachycardiac, irregularly irregular. No s3, s4, or murmurs. Abdomen: Soft, non-tender, non-distended, BS + x 4.  Extremities: No clubbing, cyanosis or edema. DP/PT/Radials 2+ and equal bilaterally. Mittens in place.   Telemetry:  Telemetry was personally reviewed and demonstrates: Atrial fibrillation, HR in 130's to 140's.    Labs/Studies     Relevant CV Studies:  None on File.   Laboratory Data:  Chemistry Recent Labs  Lab 01/14/18 1215 01/15/18 0502 01/16/18 1033  NA 137 136 136  K 4.1 3.6 3.7  CL 105 105 108  CO2 24 22 15*  GLUCOSE 101* 119* 89  BUN 10 10 19    CREATININE 1.04 0.87 1.26*  CALCIUM 8.3* 8.1* 8.1*  GFRNONAA >60 >60 57*  GFRAA >60 >60 >60  ANIONGAP 8 9 13     No results for input(s): PROT, ALBUMIN, AST, ALT, ALKPHOS, BILITOT in the last 168 hours. Hematology Recent Labs  Lab 01/12/18 1355 01/13/18 0421 01/15/18 0502 01/16/18 1033  WBC 6.6  --  7.9 8.9  RBC 3.60*  --  3.76* 3.95*  HGB 11.0*  --  11.1* 11.8*  HCT 33.3* 29.3* 33.8* 36.6*  MCV 92.5  --  89.9 92.7  MCH 30.6  --  29.5 29.9  MCHC 33.0  --  32.8 32.2  RDW 14.5  --  14.2 14.5  PLT 298  --  319 326   Cardiac EnzymesNo results for input(s): TROPONINI in the last 168 hours. No results for input(s): TROPIPOC in the last 168 hours.  BNPNo results for input(s): BNP, PROBNP in the last 168 hours.  DDimer No results for input(s): DDIMER in the last 168 hours.  Radiology/Studies:  Dg Chest 1 View  Result Date: 01/12/2018 CLINICAL DATA:  Fall from chair. EXAM: CHEST  1 VIEW COMPARISON:  Chest x-ray dated 08/12/2013. FINDINGS: Heart size and mediastinal contours are within normal limits. Lungs are clear. No pleural effusion or pneumothorax seen. Osseous structures about the chest are unremarkable. IMPRESSION: No acute findings.  Lungs are clear. Electronically Signed   By: Bary Richard M.D.   On: 01/12/2018 11:44   Ct Head Wo Contrast  Result Date: 01/14/2018 CLINICAL DATA:  Ataxia post head trauma. Found down. EXAM: CT HEAD WITHOUT CONTRAST CT CERVICAL SPINE WITHOUT CONTRAST TECHNIQUE: Multidetector CT imaging of the head and cervical spine was performed following the standard protocol without intravenous contrast. Multiplanar CT image reconstructions of the cervical spine were also generated. COMPARISON:  Head CT dated 01/12/2018. FINDINGS: CT HEAD FINDINGS Brain: New small focus of  acute subdural hemorrhage overlying the anterior margin of the lower RIGHT temporal lobe, measuring 6 mm thickness. Small amount of associated traumatic subarachnoid hemorrhage within the  adjacent sulci. No appreciable edema within the surrounding parenchyma. No significant mass effect. Additional trace subarachnoid hemorrhage within the sulcus of the superior-lateral LEFT temporal lobe, also without evidence of surrounding parenchymal edema or mass effect. There is generalized age related parenchymal volume loss with commensurate dilatation of the ventricles and sulci. Ventricles are stable in size and configuration. Chronic small vessel ischemic changes again noted within the deep periventricular white matter regions bilaterally. Vascular: Chronic calcified atherosclerotic changes of the large vessels at the skull base. No unexpected hyperdense vessel. Skull: New nondisplaced fracture within the RIGHT frontotemporal bone. Additional new nondisplaced fracture within the RIGHT zygomatic arch. Additional nondisplaced fracture within the lateral wall of the LEFT orbit, stable appearance compared to the most recent head CT of 01/12/2018. Stable comminuted fracture of the LEFT zygomatic arch. Sinuses/Orbits: Fluid/mucosal thickening within the posterior ethmoid air cells. Orbital globes appear intact and symmetric in configuration. No retro-orbital hemorrhage. Other: Scalp edema/hematoma overlying the LEFT parietooccipital bone has slightly decreased in size. Incidental note made of a slightly displaced fracture within the posterior wall of the LEFT maxillary sinus, stable compared to the recent head CT. CT CERVICAL SPINE FINDINGS Alignment: Stable dextroscoliosis. No evidence of acute vertebral body subluxation. Skull base and vertebrae: Characterization of osseous detail is slightly limited by patient motion artifact, however, there is no fracture line or displaced fracture fragment seen. Facet joints appear normally aligned. Soft tissues and spinal canal: No prevertebral fluid or swelling. No visible canal hematoma. Disc levels: Degenerative spondylosis throughout the cervical spine, mild to moderate  in degree. No more than mild central canal stenosis at any level. Upper chest: No acute findings. Other: None. IMPRESSION: 1. New small focus of acute subdural hemorrhage overlying the anterior margin of the lower RIGHT temporal lobe, measuring 6 mm thickness. Small amount of associated traumatic subarachnoid hemorrhage within the adjacent sulci. No significant mass effect. 2. Additional trace subarachnoid hemorrhage within a sulcus of the superior-lateral LEFT temporal lobe, also without evidence of surrounding parenchymal edema or mass effect. 3. New nondisplaced fracture within the RIGHT frontotemporal bone. 4. New nondisplaced fracture within the RIGHT zygomatic arch. 5. Nondisplaced fracture within the lateral wall of the LEFT orbit, likely acute but stable compared to recent head CT of 01/12/2018. 6. Slightly displaced fracture within the posterior wall of the LEFT maxillary sinus. 7. Scalp edema/hematoma over the LEFT parietooccipital bone, slightly decreased in size compared to the previous study of 01/12/2018. 8. No fracture or acute subluxation within the cervical spine. Critical Value/emergent results were called by telephone at the time of interpretation on 01/14/2018 at 12:45 pm to Dr. Onalee Hua TAT , who verbally acknowledged these results. Electronically Signed   By: Bary Richard M.D.   On: 01/14/2018 13:00   Ct Head Wo Contrast  Result Date: 01/12/2018 CLINICAL DATA:  Patient found face down on floor. Patient states he was looking for is pants. EXAM: CT HEAD WITHOUT CONTRAST CT CERVICAL SPINE WITHOUT CONTRAST TECHNIQUE: Multidetector CT imaging of the head and cervical spine was performed following the standard protocol without intravenous contrast. Multiplanar CT image reconstructions of the cervical spine were also generated. COMPARISON:  None. FINDINGS: CT HEAD FINDINGS Brain: No evidence of acute infarction, hemorrhage, hydrocephalus, extra-axial collection or mass lesion/mass effect. There is  mild diffuse low-attenuation within the subcortical and periventricular white matter compatible  with chronic microvascular disease. Prominence of the sulci and ventricles compatible with brain atrophy. Vascular: No hyperdense vessel or unexpected calcification. Skull: Normal. Negative for fracture or focal lesion. Sinuses/Orbits: There is opacification of the right mastoid air cells. Mild mucosal thickening is noted within the left maxillary sinus. The remaining paranasal sinuses and the left mastoid air cells are clear. Other: Left posterior parietal scalp hematoma noted, image 56/5. CT CERVICAL SPINE FINDINGS Alignment: Normal. Skull base and vertebrae: No acute fracture. No primary bone lesion or focal pathologic process. Soft tissues and spinal canal: No prevertebral fluid or swelling. No visible canal hematoma. Disc levels: Ventral endplate spurring and disc space narrowing identified at C3-4 and C5-6. Upper chest: A chronic appearing deformity involving the left shoulder including the distal clavicle and scapula identified. Other: None IMPRESSION: 1. No acute intracranial abnormalities. 2. Left posterior parietal scalp hematoma. 3. Chronic small vessel ischemic change and brain atrophy. 4. Right mastoid air cell opacification. 5. No evidence for cervical spine fracture 6. Cervical spine degenerative disc disease. Electronically Signed   By: Signa Kell M.D.   On: 01/12/2018 15:23   Ct Cervical Spine Wo Contrast  Result Date: 01/14/2018 CLINICAL DATA:  Ataxia post head trauma. Found down. EXAM: CT HEAD WITHOUT CONTRAST CT CERVICAL SPINE WITHOUT CONTRAST TECHNIQUE: Multidetector CT imaging of the head and cervical spine was performed following the standard protocol without intravenous contrast. Multiplanar CT image reconstructions of the cervical spine were also generated. COMPARISON:  Head CT dated 01/12/2018. FINDINGS: CT HEAD FINDINGS Brain: New small focus of acute subdural hemorrhage overlying the  anterior margin of the lower RIGHT temporal lobe, measuring 6 mm thickness. Small amount of associated traumatic subarachnoid hemorrhage within the adjacent sulci. No appreciable edema within the surrounding parenchyma. No significant mass effect. Additional trace subarachnoid hemorrhage within the sulcus of the superior-lateral LEFT temporal lobe, also without evidence of surrounding parenchymal edema or mass effect. There is generalized age related parenchymal volume loss with commensurate dilatation of the ventricles and sulci. Ventricles are stable in size and configuration. Chronic small vessel ischemic changes again noted within the deep periventricular white matter regions bilaterally. Vascular: Chronic calcified atherosclerotic changes of the large vessels at the skull base. No unexpected hyperdense vessel. Skull: New nondisplaced fracture within the RIGHT frontotemporal bone. Additional new nondisplaced fracture within the RIGHT zygomatic arch. Additional nondisplaced fracture within the lateral wall of the LEFT orbit, stable appearance compared to the most recent head CT of 01/12/2018. Stable comminuted fracture of the LEFT zygomatic arch. Sinuses/Orbits: Fluid/mucosal thickening within the posterior ethmoid air cells. Orbital globes appear intact and symmetric in configuration. No retro-orbital hemorrhage. Other: Scalp edema/hematoma overlying the LEFT parietooccipital bone has slightly decreased in size. Incidental note made of a slightly displaced fracture within the posterior wall of the LEFT maxillary sinus, stable compared to the recent head CT. CT CERVICAL SPINE FINDINGS Alignment: Stable dextroscoliosis. No evidence of acute vertebral body subluxation. Skull base and vertebrae: Characterization of osseous detail is slightly limited by patient motion artifact, however, there is no fracture line or displaced fracture fragment seen. Facet joints appear normally aligned. Soft tissues and spinal canal:  No prevertebral fluid or swelling. No visible canal hematoma. Disc levels: Degenerative spondylosis throughout the cervical spine, mild to moderate in degree. No more than mild central canal stenosis at any level. Upper chest: No acute findings. Other: None. IMPRESSION: 1. New small focus of acute subdural hemorrhage overlying the anterior margin of the lower RIGHT temporal lobe, measuring  6 mm thickness. Small amount of associated traumatic subarachnoid hemorrhage within the adjacent sulci. No significant mass effect. 2. Additional trace subarachnoid hemorrhage within a sulcus of the superior-lateral LEFT temporal lobe, also without evidence of surrounding parenchymal edema or mass effect. 3. New nondisplaced fracture within the RIGHT frontotemporal bone. 4. New nondisplaced fracture within the RIGHT zygomatic arch. 5. Nondisplaced fracture within the lateral wall of the LEFT orbit, likely acute but stable compared to recent head CT of 01/12/2018. 6. Slightly displaced fracture within the posterior wall of the LEFT maxillary sinus. 7. Scalp edema/hematoma over the LEFT parietooccipital bone, slightly decreased in size compared to the previous study of 01/12/2018. 8. No fracture or acute subluxation within the cervical spine. Critical Value/emergent results were called by telephone at the time of interpretation on 01/14/2018 at 12:45 pm to Dr. Onalee Hua TAT , who verbally acknowledged these results. Electronically Signed   By: Bary Richard M.D.   On: 01/14/2018 13:00   Ct Cervical Spine Wo Contrast  Result Date: 01/12/2018 CLINICAL DATA:  Patient found face down on floor. Patient states he was looking for is pants. EXAM: CT HEAD WITHOUT CONTRAST CT CERVICAL SPINE WITHOUT CONTRAST TECHNIQUE: Multidetector CT imaging of the head and cervical spine was performed following the standard protocol without intravenous contrast. Multiplanar CT image reconstructions of the cervical spine were also generated. COMPARISON:   None. FINDINGS: CT HEAD FINDINGS Brain: No evidence of acute infarction, hemorrhage, hydrocephalus, extra-axial collection or mass lesion/mass effect. There is mild diffuse low-attenuation within the subcortical and periventricular white matter compatible with chronic microvascular disease. Prominence of the sulci and ventricles compatible with brain atrophy. Vascular: No hyperdense vessel or unexpected calcification. Skull: Normal. Negative for fracture or focal lesion. Sinuses/Orbits: There is opacification of the right mastoid air cells. Mild mucosal thickening is noted within the left maxillary sinus. The remaining paranasal sinuses and the left mastoid air cells are clear. Other: Left posterior parietal scalp hematoma noted, image 56/5. CT CERVICAL SPINE FINDINGS Alignment: Normal. Skull base and vertebrae: No acute fracture. No primary bone lesion or focal pathologic process. Soft tissues and spinal canal: No prevertebral fluid or swelling. No visible canal hematoma. Disc levels: Ventral endplate spurring and disc space narrowing identified at C3-4 and C5-6. Upper chest: A chronic appearing deformity involving the left shoulder including the distal clavicle and scapula identified. Other: None IMPRESSION: 1. No acute intracranial abnormalities. 2. Left posterior parietal scalp hematoma. 3. Chronic small vessel ischemic change and brain atrophy. 4. Right mastoid air cell opacification. 5. No evidence for cervical spine fracture 6. Cervical spine degenerative disc disease. Electronically Signed   By: Signa Kell M.D.   On: 01/12/2018 15:23   Ct Pelvis Wo Contrast  Result Date: 01/12/2018 CLINICAL DATA:  Fall today from chair.  Negative plain films. EXAM: CT PELVIS WITHOUT CONTRAST TECHNIQUE: Multidetector CT imaging of the pelvis was performed following the standard protocol without intravenous contrast. COMPARISON:  Pelvis/hip x-rays today. FINDINGS: Urinary Tract:  Within normal. Bowel: Mild  diverticulosis of the colon. Moderate segment of sigmoid colon extends into a left inguinal hernia down into the left scrotum. No obstruction. Vascular/Lymphatic: Calcified plaque over the abdominal aorta. No adenopathy. Reproductive:  Unremarkable. Other:  Left inguinal hernia as described above.  No free fluid. Musculoskeletal: No significant degenerative changes of the hips. No acute fracture or dislocation. Minimal degenerate change of the spine. IMPRESSION: No acute fracture. Left inguinal hernia containing a moderate segment of colon which extends down into the left  Colonic diverticulosis. Aortic Atherosclerosis (ICD10-I70.0). Scrotum. Electronically Signed   By: Elberta Fortis M.D.   On: 01/12/2018 15:24   Dg Chest Port 1 View  Result Date: 01/16/2018 CLINICAL DATA:  Increased shortness of breath. Low-grade fever. Irregular heart rate. EXAM: PORTABLE CHEST 1 VIEW COMPARISON:  Chest x-rays dated 01/12/2018 and 08/12/2013. FINDINGS: Heart size and mediastinal contours are within normal limits. Patchy opacities are seen bilaterally, particularly prominent at the medial aspects of the RIGHT lung base. Coarse interstitial markings bilaterally suggesting interstitial edema. No pleural effusions seen. No pneumothorax seen. Chronic appearing deformity of the distal LEFT clavicle. No acute appearing osseous abnormality. IMPRESSION: 1. Patchy opacities bilaterally, particularly prominent at the medial aspects of the RIGHT lung base, concerning for multifocal pneumonia. 2. Coarse interstitial markings suggesting associated interstitial edema. Electronically Signed   By: Bary Richard M.D.   On: 01/16/2018 11:09   Dg Knee Complete 4 Views Left  Result Date: 01/12/2018 CLINICAL DATA:  Fall.  Slid out of a chair.  Initial encounter. EXAM: LEFT KNEE - COMPLETE 4+ VIEW COMPARISON:  None. FINDINGS: There is no evidence of acute fracture, dislocation, or knee joint effusion. No significant arthropathic changes are  identified. Atherosclerotic vascular calcifications are noted. IMPRESSION: Negative. Electronically Signed   By: Sebastian Ache M.D.   On: 01/12/2018 11:38   Dg Knee Complete 4 Views Right  Result Date: 01/12/2018 CLINICAL DATA:  Fall from chair EXAM: RIGHT KNEE - COMPLETE 4+ VIEW COMPARISON:  None. FINDINGS: Osseous alignment is normal. No fracture line or displaced fracture fragment seen. No acute or suspicious osseous lesion. No appreciable joint effusion and adjacent soft tissues are unremarkable. IMPRESSION: Negative. Electronically Signed   By: Bary Richard M.D.   On: 01/12/2018 11:43   Dg Hips Bilat W Or Wo Pelvis 2 Views  Result Date: 01/12/2018 CLINICAL DATA:  Fall from chair. EXAM: DG HIP (WITH OR WITHOUT PELVIS) 2V BILAT COMPARISON:  None. FINDINGS: Osseous alignment is normal. No fracture line or displaced fracture fragment seen. No degenerative change at either hip joint. Bowel is noted within the scrotal sac, consistent with large hernia. Soft tissues about the pelvis and hips are otherwise unremarkable. IMPRESSION: 1. No osseous fracture or dislocation. 2. Herniated bowel within the scrotal sac. Electronically Signed   By: Bary Richard M.D.   On: 01/12/2018 11:40     Assessment & Plan    1. New-Onset Atrial Fibrillation with RVR - newly documented this admission and as the patient is currently non-verbal and not responding to questions, it is unclear if he is symptomatic. Was noted to be tachycardiac yesterday and EKG's have since confirmed he is in atrial fibrillation with RVR. Unknown chronicity but HR was well-controlled at the time of admission.  - Labs upon admission show WBC 6.6, Hgb 11.0, platelets 298, Na+ 138, K+ 3.4, and creatinine 0.98. TSH 0.677. Mg 1.9.  - HR currently in the 130's to 140's. Was started on PO Lopressor 12.5mg  BID yesterday but evening dosing was held. Did receive additional dose at 1105 this morning. He is scheduled to start IV Cardizem and is awaiting  ICU bed placement. If going to be a significant wait, would recommend dosing scheduled IV Lopressor Q6H with hold parameters for SBP < 90. An echocardiogram is not on file and would not significantly change the management strategy at this point. I suspect he would not remain still for imaging to be obtained either. Would error on the side of caution and switch to BB  therapy once HR does improve.  - This patients CHA2DS2-VASc Score and unadjusted Ischemic Stroke Rate (% per year) is equal to 0.6 % stroke rate/year from a score of 1 (Age). Overall score is low but limited information about his history is currently available.  Would not be an anticoagulation candidate given his dementia, advanced age, frequent falls, and recently diagnosed subdural and subarachnoid hemorrhage.   2. Subdural and Subarachnoid Hemorrhage - CT Head showed a new small focus of acute subdural hemorrhage overlying the anterior margin of the lower right temporal lobe, measuring 6 mm this. Also noted to have a small amount of associated traumatic subarachnoid hemorrhage with no significant mass-effect. - further management per admitting.   3. 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 Appears chronically ill-appearing. Recommend Palliative Care Consult for Goals of Care as he is currently Full Code. 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   For questions or updates, please contact [facility] Please consult [email or web address] for contact info under Cardiology/STEMI.  Signed, Ellsworth Lennox, PA-C 01/16/2018, 11:19 AM Pager: [phone]   Attending note:  Patient seen and examined, case discussed with Ms. Patrick Jupiter.  I reviewed his records.  Mr. Pellow is a 72 year old male with dementia and failure to thrive (palliative care consultation pending), nursing home resident, currently admitted with frequent falls.  Head CT imaging shows  acute subdural hemorrhage over the anterior margin of the lower right temporal lobe and also a small amount of associated traumatic subarachnoid hemorrhage but no mass-effect.  He has a Comptroller in the room, nonverbal and unable to provide history.  He was noted to have elevated heart rate resulting in ECG which shows rapid atrial fibrillation.  He has been placed on telemetry as well as Lopressor, today placed on IV diltiazem by primary team and transferred to the ICU.  Currently heart rate is fast as the 150s, systolic blood pressure 100-130.  He is in no obvious distress.  Lab work shows potassium 3.7, BUN 19, creatinine 1.26, hemoglobin 11.6, platelets 326.  I personally reviewed his ECG from today which shows rapid atrial fibrillation.  Chest x-ray reports interstitial edema and patchy infiltrates concerning for multifocal pneumonia.  CHADSVASC score is approximately 2 based on available information (age and  incidentally noted atherosclerosis by CT imaging).  He is not a good candidate however for anticoagulation (and therefore not efforts to pursue cardioversion).  Would focus on heart rate control and general supportive measures.  Do not anticipate an aggressive cardiac evaluation and would agree with plans for palliative care consultation.  Jonelle Sidle, M.D., F.A.C.C.

## 2018-01-17 ENCOUNTER — Encounter (HOSPITAL_COMMUNITY): Payer: Self-pay

## 2018-01-17 LAB — CBC
HCT: 31.3 % — ABNORMAL LOW (ref 39.0–52.0)
Hemoglobin: 10.4 g/dL — ABNORMAL LOW (ref 13.0–17.0)
MCH: 29.9 pg (ref 26.0–34.0)
MCHC: 33.2 g/dL (ref 30.0–36.0)
MCV: 89.9 fL (ref 80.0–100.0)
NRBC: 0 % (ref 0.0–0.2)
Platelets: 339 10*3/uL (ref 150–400)
RBC: 3.48 MIL/uL — ABNORMAL LOW (ref 4.22–5.81)
RDW: 14.6 % (ref 11.5–15.5)
WBC: 8.9 10*3/uL (ref 4.0–10.5)

## 2018-01-17 LAB — COMPREHENSIVE METABOLIC PANEL
ALT: 27 U/L (ref 0–44)
AST: 36 U/L (ref 15–41)
Albumin: 2.4 g/dL — ABNORMAL LOW (ref 3.5–5.0)
Alkaline Phosphatase: 58 U/L (ref 38–126)
Anion gap: 10 (ref 5–15)
BUN: 25 mg/dL — ABNORMAL HIGH (ref 8–23)
CHLORIDE: 110 mmol/L (ref 98–111)
CO2: 19 mmol/L — ABNORMAL LOW (ref 22–32)
Calcium: 8.1 mg/dL — ABNORMAL LOW (ref 8.9–10.3)
Creatinine, Ser: 1.17 mg/dL (ref 0.61–1.24)
GFR calc Af Amer: >60 mL/min (ref >60)
GFR calc non Af Amer: >60 mL/min (ref >60)
Glucose, Bld: 113 mg/dL — ABNORMAL HIGH (ref 70–99)
Potassium: 3.8 mmol/L (ref 3.5–5.1)
Sodium: 139 mmol/L (ref 135–145)
Total Bilirubin: 1.8 mg/dL — ABNORMAL HIGH (ref 0.3–1.2)
Total Protein: 5.6 g/dL — ABNORMAL LOW (ref 6.5–8.1)

## 2018-01-17 LAB — MAGNESIUM: Magnesium: 1.9 mg/dL (ref 1.7–2.4)

## 2018-01-17 LAB — AMMONIA: Ammonia: 15 umol/L (ref 9–35)

## 2018-01-17 MED ORDER — ORAL CARE MOUTH RINSE
15.0000 mL | Freq: Two times a day (BID) | OROMUCOSAL | Status: DC
  Administered 2018-01-17 – 2018-01-20 (×7): 15 mL via OROMUCOSAL

## 2018-01-17 MED ORDER — LORAZEPAM 2 MG/ML IJ SOLN
0.5000 mg | Freq: Once | INTRAMUSCULAR | Status: AC
  Administered 2018-01-17: 0.5 mg via INTRAVENOUS
  Filled 2018-01-17: qty 1

## 2018-01-17 MED ORDER — CHLORHEXIDINE GLUCONATE 0.12 % MT SOLN
15.0000 mL | Freq: Two times a day (BID) | OROMUCOSAL | Status: DC
  Administered 2018-01-17 – 2018-01-20 (×8): 15 mL via OROMUCOSAL
  Filled 2018-01-17 (×3): qty 15

## 2018-01-17 MED ORDER — MORPHINE SULFATE (PF) 2 MG/ML IV SOLN
2.0000 mg | INTRAVENOUS | Status: AC | PRN
  Administered 2018-01-17 – 2018-01-18 (×2): 2 mg via INTRAVENOUS
  Filled 2018-01-17 (×2): qty 1

## 2018-01-17 MED ORDER — ALBUTEROL SULFATE (2.5 MG/3ML) 0.083% IN NEBU: 2.5000 mg | INHALATION_SOLUTION | Freq: Four times a day (QID) | RESPIRATORY_TRACT | Status: DC | PRN

## 2018-01-17 MED ORDER — LORAZEPAM 2 MG/ML IJ SOLN
0.5000 mg | Freq: Four times a day (QID) | INTRAMUSCULAR | Status: DC | PRN
  Administered 2018-01-17 – 2018-01-18 (×2): 0.5 mg via INTRAVENOUS
  Filled 2018-01-17: qty 1

## 2018-01-17 MED ORDER — IPRATROPIUM-ALBUTEROL 0.5-2.5 (3) MG/3ML IN SOLN
3.0000 mL | Freq: Three times a day (TID) | RESPIRATORY_TRACT | Status: DC
  Administered 2018-01-17 – 2018-01-18 (×5): 3 mL via RESPIRATORY_TRACT
  Filled 2018-01-17 (×7): qty 3

## 2018-01-17 MED ORDER — LORAZEPAM 2 MG/ML IJ SOLN
INTRAMUSCULAR | Status: AC
  Administered 2018-01-17: 0.5 mg via INTRAVENOUS
  Filled 2018-01-17: qty 1

## 2018-01-17 NOTE — Progress Notes (Signed)
PROGRESS NOTE  Pilar JarvisJames Schimming GEX:[id number] DOB: 01/02/1946 DOA: 01/12/2018 PCP: Avon GullyFanta, Tesfaye, MD  Brief History:  72 year old male with history of severe dementia, COPD brought to the ED after unwitnessed fall. He is a resident of a care home. At baseline reportedly he is ambulatory but confused and requiring assistance with all his ADLs. History very limited. In the ED vitals were stable. Blood work was unremarkable. He fell on the floor in the ED. X-rays and head CT negative for any fracture, showed posterior scalp hematoma and chronic small ischemic vessel changes. Patient placed in observation for further management and physical therapy evaluation. During his hospitalization, the patient had intermittent episodes of hospital delirium with agitation. He required Haldol and Ativan. Unfortunately, the patient sustained a mechanical fall on the morning of 01/14/2018. CT of the head showed new small subdural hemorrhage measuring 6 mm in the right temporal lobe. There is a small amount of traumatic subarachnoid hemorrhage in the adjacent sulci. There is a trace subdural hemorrhage in the lateral temporal lobe on the left. There is also a nondisplaced mandibular fracture on the right frontal temporal bone as well as the right zygomatic arch. There was a nondisplaced fracture of the lateral wall of the left orbit and left maxillary sinus. The case was discussed with neurosurgery and ENT trauma. They both recommended conservative, nonoperative management.  Unfortunately, the patient's medical condition declined on 01/16/2018.  The patient was noted to have dyspnea.  Work-up revealed the patient had atrial fibrillation with RVR as well as healthcare associated pneumonia.  The patient was transferred to the ICU.  As expected, his mental status worsened with his clinical decline.  Assessment/Plan: Aspiration pneumonia/healthcare associated pneumonia -Continue Zosyn -Personally reviewed  chest x-ray--increasing right >L lower lobe opacity -Reconsulted speech therapy for repeat evaluation-->NPO  Paroxysmal atrial fibrillation with RVR -Appreciate cardiology consult--> not a good candidate for anticoagulation due to SDH, SAH -Not a candidate for aggressive measures -Focus on heart rate control -CHADVASc = 2 -spontaneously converted back to sinus with PACx -continue diltiazem drip as pt not safe for po intake  Subdural hematoma/subarachnoid hemorrhage -01/14/2018 CT brain--subdural hemorrhage and subarachnoid hemorrhage as discussed above -Case was discussed with neurosurgery(Dr. Elsner)and ENT trauma-->recommend nonoperative, conservative management -Discontinue enoxaparin -Place SCDs  Facial fractures/maxillary sinus fracture -Secondary to mechanical fall -Case discussed with ENT trauma(Dr. Thayer Ohmhris Newman)-->conservative management,nonoperative management  Gait Instability/Frequent Falls -TSH 0.677 -Urinalysis negative for pyuria -Magnesium 1.9 -Serum B12 149 -RBC folate--1896 -CK--893>>>397  Mild rhabdomyolysis-traumatic -CPK893>>>786>>397 -StartedIV fluids -Repeat CPKtrending down  B12 deficiency -Supplement IM during hospitalization -Discharged with oral supplementation -Repeat serum B12 in 1 month  Hypokalemia -Repleted -Magnesium 1.9  Cognitive impairment/dementia -Continue olanzapine at bedtime and Cogentin -Haldol as needed agitation -Patient is at risk for hospital delirium -Patient has episodes of impulsiveness-->patient had mechanical fall a.m. 01/14/2018  Dehydration -Continue IVF during the hospitalization  Stage I pressure injury--sacrum -Local wound care -Not infected on exam -Present prior to admission  Goals of care -Palliative medicine has been consulted -The patient is a ward of the state -The patient's overall prognosis is a very poor -I suspect he may not survive this hospitalization -Numerous medical  staff and ancillary staff have tried to contact the patient's social worker at social [facility] [place]--messages have been left on voicemail without return calls -I personally spoke Catering managerthe director of [facility] [place]-->filled out appropriate paperwork for DNR -If no clinical improvement, will  need to discuss comfort care measures with DSS   Disposition Plan:  remain in stepdown Family Communication: no return calls from guardian;  I insisted upon speaking with DSS director  Consultants: Cardiology, palliative medicine  Code Status:  FULL  DVT Prophylaxis:  SCDs   Procedures: As Listed in Progress Note Above  Antibiotics: Zosyn 12/6>>    Subjective: Pt is awake but somnolent.  He does not follow commands.  Continues to have agitation.  No vomiting, respiratory distress. No vomiting, diarrhea or respiratory distress  Objective: Vitals:   01/17/18 0945 01/17/18 1000 01/17/18 1015 01/17/18 1030  BP: 107/82 113/81 116/70 118/74  Pulse: (!) 58 88 90 91  Resp: (!) 24 (!) 25 20 (!) 23  Temp:      TempSrc:      SpO2: 100% 99% 96% 96%  Weight:      Height:        Intake/Output Summary (Last 24 hours) at 01/17/2018 1049 Last data filed at 01/17/2018 0603 Gross per 24 hour  Intake 1488.15 ml  Output -  Net 1488.15 ml   Weight change:  Exam:   General:  Pt is somnolent, does not follow commands appropriately, not in acute distress  HEENT: No icterus, No thrush, No neck mass, [place]/AT  Cardiovascular: RRR, S1/S2, no rubs, no gallops  Respiratory: bibasilar rales, scattered basilar wheeze  Abdomen: Soft/+BS, non tender, non distended, no guarding  Extremities: No edema, No lymphangitis, No petechiae, No rashes, no synovitis   Data Reviewed: I have personally reviewed following labs and imaging studies Basic Metabolic Panel: Recent Labs  Lab 01/13/18 0421 01/14/18 1215 01/15/18 0502 01/16/18 1033 01/17/18 0529  01/17/18 0530  NA 139 137 136 136  --  139  K 3.7 4.1 3.6 3.7  --  3.8  CL 110 105 105 108  --  110  CO2 24 24 22  15*  --  19*  GLUCOSE 87 101* 119* 89  --  113*  BUN 17 10 10 19   --  25*  CREATININE 1.08 1.04 0.87 1.26*  --  1.17  CALCIUM 7.9* 8.3* 8.1* 8.1*  --  8.1*  MG 1.9 1.9  --   --  1.9  --    Liver Function Tests: Recent Labs  Lab 01/17/18 0530  AST 36  ALT 27  ALKPHOS 58  BILITOT 1.8*  PROT 5.6*  ALBUMIN 2.4*   No results for input(s): LIPASE, AMYLASE in the last 168 hours. Recent Labs  Lab 01/17/18 0530  AMMONIA 15   Coagulation Profile: No results for input(s): INR, PROTIME in the last 168 hours. CBC: Recent Labs  Lab 01/12/18 1355 01/13/18 0421 01/15/18 0502 01/16/18 1033 01/17/18 0529  WBC 6.6  --  7.9 8.9 8.9  NEUTROABS 5.4  --   --   --   --   HGB 11.0*  --  11.1* 11.8* 10.4*  HCT 33.3* 29.3* 33.8* 36.6* 31.3*  MCV 92.5  --  89.9 92.7 89.9  PLT 298  --  319 326 339   Cardiac Enzymes: Recent Labs  Lab 01/14/18 1215 01/15/18 0502 01/16/18 1033  CKTOTAL 893* 786* 397   BNP: Invalid input(s): POCBNP CBG: No results for input(s): GLUCAP in the last 168 hours. HbA1C: No results for input(s): HGBA1C in the last 72 hours. Urine analysis:    Component Value Date/Time   COLORURINE YELLOW 01/12/2018 1327   APPEARANCEUR CLEAR 01/12/2018 1327   LABSPEC 1.025 01/12/2018 1327   PHURINE 6.0 01/12/2018 1327  GLUCOSEU NEGATIVE 01/12/2018 1327   HGBUR NEGATIVE 01/12/2018 1327   BILIRUBINUR NEGATIVE 01/12/2018 1327   KETONESUR 20 (A) 01/12/2018 1327   PROTEINUR NEGATIVE 01/12/2018 1327   NITRITE NEGATIVE 01/12/2018 1327   LEUKOCYTESUR NEGATIVE 01/12/2018 1327   Sepsis Labs: @LABRCNTIP (procalcitonin:4,lacticidven:4) ) Recent Results (from the past 240 hour(s))  MRSA PCR Screening     Status: None   Collection Time: 01/12/18  9:16 PM  Result Value Ref Range Status   MRSA by PCR NEGATIVE NEGATIVE Final    Comment:        The GeneXpert MRSA  Assay (FDA approved for NASAL specimens only), is one component of a comprehensive MRSA colonization surveillance program. It is not intended to diagnose MRSA infection nor to guide or monitor treatment for MRSA infections. Performed at [facility], [address], [place], [place] [address]   MRSA PCR Screening     Status: None   Collection Time: 01/16/18  7:45 PM  Result Value Ref Range Status   MRSA by PCR NEGATIVE NEGATIVE Final    Comment:        The GeneXpert MRSA Assay (FDA approved for NASAL specimens only), is one component of a comprehensive MRSA colonization surveillance program. It is not intended to diagnose MRSA infection nor to guide or monitor treatment for MRSA infections. Performed at [facility], [address], [place], [place] [address]      Scheduled Meds: . chlorhexidine  15 mL Mouth Rinse BID  . cyanocobalamin  1,000 mcg Intramuscular Daily  . folic acid  1 mg Intravenous Daily  . mouth rinse  15 mL Mouth Rinse q12n4p  . pantoprazole (PROTONIX) IV  40 mg Intravenous Q24H   Continuous Infusions: . sodium chloride Stopped (01/17/18 0551)  . 0.9 % NaCl with KCl 20 mEq / L 75 mL/hr at 01/17/18 1009  . diltiazem (CARDIZEM) infusion 5 mg/hr (01/17/18 0603)  . piperacillin-tazobactam (ZOSYN)  IV 12.5 mL/hr at 01/17/18 0981    Procedures/Studies: Dg Chest 1 View  Result Date: 01/12/2018 CLINICAL DATA:  Fall from chair. EXAM: CHEST  1 VIEW COMPARISON:  Chest x-ray dated 08/12/2013. FINDINGS: Heart size and mediastinal contours are within normal limits. Lungs are clear. No pleural effusion or pneumothorax seen. Osseous structures about the chest are unremarkable. IMPRESSION: No acute findings.  Lungs are clear. Electronically Signed   By: Bary Richard M.D.   On: 01/12/2018 11:44   Ct Head Wo Contrast  Result Date: 01/14/2018 CLINICAL DATA:  Ataxia post head trauma. Found down. EXAM: CT HEAD WITHOUT CONTRAST CT CERVICAL SPINE WITHOUT CONTRAST TECHNIQUE:  Multidetector CT imaging of the head and cervical spine was performed following the standard protocol without intravenous contrast. Multiplanar CT image reconstructions of the cervical spine were also generated. COMPARISON:  Head CT dated 01/12/2018. FINDINGS: CT HEAD FINDINGS Brain: New small focus of acute subdural hemorrhage overlying the anterior margin of the lower RIGHT temporal lobe, measuring 6 mm thickness. Small amount of associated traumatic subarachnoid hemorrhage within the adjacent sulci. No appreciable edema within the surrounding parenchyma. No significant mass effect. Additional trace subarachnoid hemorrhage within the sulcus of the superior-lateral LEFT temporal lobe, also without evidence of surrounding parenchymal edema or mass effect. There is generalized age related parenchymal volume loss with commensurate dilatation of the ventricles and sulci. Ventricles are stable in size and configuration. Chronic small vessel ischemic changes again noted within the deep periventricular white matter regions bilaterally. Vascular: Chronic calcified atherosclerotic changes of the large vessels at the skull  base. No unexpected hyperdense vessel. Skull: New nondisplaced fracture within the RIGHT frontotemporal bone. Additional new nondisplaced fracture within the RIGHT zygomatic arch. Additional nondisplaced fracture within the lateral wall of the LEFT orbit, stable appearance compared to the most recent head CT of 01/12/2018. Stable comminuted fracture of the LEFT zygomatic arch. Sinuses/Orbits: Fluid/mucosal thickening within the posterior ethmoid air cells. Orbital globes appear intact and symmetric in configuration. No retro-orbital hemorrhage. Other: Scalp edema/hematoma overlying the LEFT parietooccipital bone has slightly decreased in size. Incidental note made of a slightly displaced fracture within the posterior wall of the LEFT maxillary sinus, stable compared to the recent head CT. CT CERVICAL SPINE  FINDINGS Alignment: Stable dextroscoliosis. No evidence of acute vertebral body subluxation. Skull base and vertebrae: Characterization of osseous detail is slightly limited by patient motion artifact, however, there is no fracture line or displaced fracture fragment seen. Facet joints appear normally aligned. Soft tissues and spinal canal: No prevertebral fluid or swelling. No visible canal hematoma. Disc levels: Degenerative spondylosis throughout the cervical spine, mild to moderate in degree. No more than mild central canal stenosis at any level. Upper chest: No acute findings. Other: None. IMPRESSION: 1. New small focus of acute subdural hemorrhage overlying the anterior margin of the lower RIGHT temporal lobe, measuring 6 mm thickness. Small amount of associated traumatic subarachnoid hemorrhage within the adjacent sulci. No significant mass effect. 2. Additional trace subarachnoid hemorrhage within a sulcus of the superior-lateral LEFT temporal lobe, also without evidence of surrounding parenchymal edema or mass effect. 3. New nondisplaced fracture within the RIGHT frontotemporal bone. 4. New nondisplaced fracture within the RIGHT zygomatic arch. 5. Nondisplaced fracture within the lateral wall of the LEFT orbit, likely acute but stable compared to recent head CT of 01/12/2018. 6. Slightly displaced fracture within the posterior wall of the LEFT maxillary sinus. 7. Scalp edema/hematoma over the LEFT parietooccipital bone, slightly decreased in size compared to the previous study of 01/12/2018. 8. No fracture or acute subluxation within the cervical spine. Critical Value/emergent results were called by telephone at the time of interpretation on 01/14/2018 at 12:45 pm to Dr. Onalee Hua Nishat Livingston , who verbally acknowledged these results. Electronically Signed   By: Bary Richard M.D.   On: 01/14/2018 13:00   Ct Head Wo Contrast  Result Date: 01/12/2018 CLINICAL DATA:  Patient found face down on floor. Patient states he  was looking for is pants. EXAM: CT HEAD WITHOUT CONTRAST CT CERVICAL SPINE WITHOUT CONTRAST TECHNIQUE: Multidetector CT imaging of the head and cervical spine was performed following the standard protocol without intravenous contrast. Multiplanar CT image reconstructions of the cervical spine were also generated. COMPARISON:  None. FINDINGS: CT HEAD FINDINGS Brain: No evidence of acute infarction, hemorrhage, hydrocephalus, extra-axial collection or mass lesion/mass effect. There is mild diffuse low-attenuation within the subcortical and periventricular white matter compatible with chronic microvascular disease. Prominence of the sulci and ventricles compatible with brain atrophy. Vascular: No hyperdense vessel or unexpected calcification. Skull: Normal. Negative for fracture or focal lesion. Sinuses/Orbits: There is opacification of the right mastoid air cells. Mild mucosal thickening is noted within the left maxillary sinus. The remaining paranasal sinuses and the left mastoid air cells are clear. Other: Left posterior parietal scalp hematoma noted, image 56/5. CT CERVICAL SPINE FINDINGS Alignment: Normal. Skull base and vertebrae: No acute fracture. No primary bone lesion or focal pathologic process. Soft tissues and spinal canal: No prevertebral fluid or swelling. No visible canal hematoma. Disc levels: Ventral endplate spurring and disc space  narrowing identified at C3-4 and C5-6. Upper chest: A chronic appearing deformity involving the left shoulder including the distal clavicle and scapula identified. Other: None IMPRESSION: 1. No acute intracranial abnormalities. 2. Left posterior parietal scalp hematoma. 3. Chronic small vessel ischemic change and brain atrophy. 4. Right mastoid air cell opacification. 5. No evidence for cervical spine fracture 6. Cervical spine degenerative disc disease. Electronically Signed   By: Signa Kell M.D.   On: 01/12/2018 15:23   Ct Cervical Spine Wo Contrast  Result Date:  01/14/2018 CLINICAL DATA:  Ataxia post head trauma. Found down. EXAM: CT HEAD WITHOUT CONTRAST CT CERVICAL SPINE WITHOUT CONTRAST TECHNIQUE: Multidetector CT imaging of the head and cervical spine was performed following the standard protocol without intravenous contrast. Multiplanar CT image reconstructions of the cervical spine were also generated. COMPARISON:  Head CT dated 01/12/2018. FINDINGS: CT HEAD FINDINGS Brain: New small focus of acute subdural hemorrhage overlying the anterior margin of the lower RIGHT temporal lobe, measuring 6 mm thickness. Small amount of associated traumatic subarachnoid hemorrhage within the adjacent sulci. No appreciable edema within the surrounding parenchyma. No significant mass effect. Additional trace subarachnoid hemorrhage within the sulcus of the superior-lateral LEFT temporal lobe, also without evidence of surrounding parenchymal edema or mass effect. There is generalized age related parenchymal volume loss with commensurate dilatation of the ventricles and sulci. Ventricles are stable in size and configuration. Chronic small vessel ischemic changes again noted within the deep periventricular white matter regions bilaterally. Vascular: Chronic calcified atherosclerotic changes of the large vessels at the skull base. No unexpected hyperdense vessel. Skull: New nondisplaced fracture within the RIGHT frontotemporal bone. Additional new nondisplaced fracture within the RIGHT zygomatic arch. Additional nondisplaced fracture within the lateral wall of the LEFT orbit, stable appearance compared to the most recent head CT of 01/12/2018. Stable comminuted fracture of the LEFT zygomatic arch. Sinuses/Orbits: Fluid/mucosal thickening within the posterior ethmoid air cells. Orbital globes appear intact and symmetric in configuration. No retro-orbital hemorrhage. Other: Scalp edema/hematoma overlying the LEFT parietooccipital bone has slightly decreased in size. Incidental note made of  a slightly displaced fracture within the posterior wall of the LEFT maxillary sinus, stable compared to the recent head CT. CT CERVICAL SPINE FINDINGS Alignment: Stable dextroscoliosis. No evidence of acute vertebral body subluxation. Skull base and vertebrae: Characterization of osseous detail is slightly limited by patient motion artifact, however, there is no fracture line or displaced fracture fragment seen. Facet joints appear normally aligned. Soft tissues and spinal canal: No prevertebral fluid or swelling. No visible canal hematoma. Disc levels: Degenerative spondylosis throughout the cervical spine, mild to moderate in degree. No more than mild central canal stenosis at any level. Upper chest: No acute findings. Other: None. IMPRESSION: 1. New small focus of acute subdural hemorrhage overlying the anterior margin of the lower RIGHT temporal lobe, measuring 6 mm thickness. Small amount of associated traumatic subarachnoid hemorrhage within the adjacent sulci. No significant mass effect. 2. Additional trace subarachnoid hemorrhage within a sulcus of the superior-lateral LEFT temporal lobe, also without evidence of surrounding parenchymal edema or mass effect. 3. New nondisplaced fracture within the RIGHT frontotemporal bone. 4. New nondisplaced fracture within the RIGHT zygomatic arch. 5. Nondisplaced fracture within the lateral wall of the LEFT orbit, likely acute but stable compared to recent head CT of 01/12/2018. 6. Slightly displaced fracture within the posterior wall of the LEFT maxillary sinus. 7. Scalp edema/hematoma over the LEFT parietooccipital bone, slightly decreased in size compared to the previous study of  01/12/2018. 8. No fracture or acute subluxation within the cervical spine. Critical Value/emergent results were called by telephone at the time of interpretation on 01/14/2018 at 12:45 pm to Dr. Onalee Hua Elin Seats , who verbally acknowledged these results. Electronically Signed   By: Bary Richard M.D.    On: 01/14/2018 13:00   Ct Cervical Spine Wo Contrast  Result Date: 01/12/2018 CLINICAL DATA:  Patient found face down on floor. Patient states he was looking for is pants. EXAM: CT HEAD WITHOUT CONTRAST CT CERVICAL SPINE WITHOUT CONTRAST TECHNIQUE: Multidetector CT imaging of the head and cervical spine was performed following the standard protocol without intravenous contrast. Multiplanar CT image reconstructions of the cervical spine were also generated. COMPARISON:  None. FINDINGS: CT HEAD FINDINGS Brain: No evidence of acute infarction, hemorrhage, hydrocephalus, extra-axial collection or mass lesion/mass effect. There is mild diffuse low-attenuation within the subcortical and periventricular white matter compatible with chronic microvascular disease. Prominence of the sulci and ventricles compatible with brain atrophy. Vascular: No hyperdense vessel or unexpected calcification. Skull: Normal. Negative for fracture or focal lesion. Sinuses/Orbits: There is opacification of the right mastoid air cells. Mild mucosal thickening is noted within the left maxillary sinus. The remaining paranasal sinuses and the left mastoid air cells are clear. Other: Left posterior parietal scalp hematoma noted, image 56/5. CT CERVICAL SPINE FINDINGS Alignment: Normal. Skull base and vertebrae: No acute fracture. No primary bone lesion or focal pathologic process. Soft tissues and spinal canal: No prevertebral fluid or swelling. No visible canal hematoma. Disc levels: Ventral endplate spurring and disc space narrowing identified at C3-4 and C5-6. Upper chest: A chronic appearing deformity involving the left shoulder including the distal clavicle and scapula identified. Other: None IMPRESSION: 1. No acute intracranial abnormalities. 2. Left posterior parietal scalp hematoma. 3. Chronic small vessel ischemic change and brain atrophy. 4. Right mastoid air cell opacification. 5. No evidence for cervical spine fracture 6. Cervical  spine degenerative disc disease. Electronically Signed   By: Signa Kell M.D.   On: 01/12/2018 15:23   Ct Pelvis Wo Contrast  Result Date: 01/12/2018 CLINICAL DATA:  Fall today from chair.  Negative plain films. EXAM: CT PELVIS WITHOUT CONTRAST TECHNIQUE: Multidetector CT imaging of the pelvis was performed following the standard protocol without intravenous contrast. COMPARISON:  Pelvis/hip x-rays today. FINDINGS: Urinary Tract:  Within normal. Bowel: Mild diverticulosis of the colon. Moderate segment of sigmoid colon extends into a left inguinal hernia down into the left scrotum. No obstruction. Vascular/Lymphatic: Calcified plaque over the abdominal aorta. No adenopathy. Reproductive:  Unremarkable. Other:  Left inguinal hernia as described above.  No free fluid. Musculoskeletal: No significant degenerative changes of the hips. No acute fracture or dislocation. Minimal degenerate change of the spine. IMPRESSION: No acute fracture. Left inguinal hernia containing a moderate segment of colon which extends down into the left Colonic diverticulosis. Aortic Atherosclerosis (ICD10-I70.0). Scrotum. Electronically Signed   By: Elberta Fortis M.D.   On: 01/12/2018 15:24   Dg Chest Port 1 View  Result Date: 01/16/2018 CLINICAL DATA:  Increased shortness of breath. Low-grade fever. Irregular heart rate. EXAM: PORTABLE CHEST 1 VIEW COMPARISON:  Chest x-rays dated 01/12/2018 and 08/12/2013. FINDINGS: Heart size and mediastinal contours are within normal limits. Patchy opacities are seen bilaterally, particularly prominent at the medial aspects of the RIGHT lung base. Coarse interstitial markings bilaterally suggesting interstitial edema. No pleural effusions seen. No pneumothorax seen. Chronic appearing deformity of the distal LEFT clavicle. No acute appearing osseous abnormality. IMPRESSION: 1. Patchy opacities  bilaterally, particularly prominent at the medial aspects of the RIGHT lung base, concerning for  multifocal pneumonia. 2. Coarse interstitial markings suggesting associated interstitial edema. Electronically Signed   By: Bary Richard M.D.   On: 01/16/2018 11:09   Dg Knee Complete 4 Views Left  Result Date: 01/12/2018 CLINICAL DATA:  Fall.  Slid out of a chair.  Initial encounter. EXAM: LEFT KNEE - COMPLETE 4+ VIEW COMPARISON:  None. FINDINGS: There is no evidence of acute fracture, dislocation, or knee joint effusion. No significant arthropathic changes are identified. Atherosclerotic vascular calcifications are noted. IMPRESSION: Negative. Electronically Signed   By: Sebastian Ache M.D.   On: 01/12/2018 11:38   Dg Knee Complete 4 Views Right  Result Date: 01/12/2018 CLINICAL DATA:  Fall from chair EXAM: RIGHT KNEE - COMPLETE 4+ VIEW COMPARISON:  None. FINDINGS: Osseous alignment is normal. No fracture line or displaced fracture fragment seen. No acute or suspicious osseous lesion. No appreciable joint effusion and adjacent soft tissues are unremarkable. IMPRESSION: Negative. Electronically Signed   By: Bary Richard M.D.   On: 01/12/2018 11:43   Dg Hips Bilat W Or Wo Pelvis 2 Views  Result Date: 01/12/2018 CLINICAL DATA:  Fall from chair. EXAM: DG HIP (WITH OR WITHOUT PELVIS) 2V BILAT COMPARISON:  None. FINDINGS: Osseous alignment is normal. No fracture line or displaced fracture fragment seen. No degenerative change at either hip joint. Bowel is noted within the scrotal sac, consistent with large hernia. Soft tissues about the pelvis and hips are otherwise unremarkable. IMPRESSION: 1. No osseous fracture or dislocation. 2. Herniated bowel within the scrotal sac. Electronically Signed   By: Bary Richard M.D.   On: 01/12/2018 11:40    Catarina Hartshorn, DO  [facility] Pager [phone]  If 7PM-7AM, please contact night-coverage [email or web address] Password [email or web address] 01/17/2018, 10:49 AM   LOS: 3 days

## 2018-01-17 NOTE — Progress Notes (Signed)
md notified of pt being very restless, pulling at tubes, ivs, taking mitts off but concern with pts qtc .49 and .50. New order received.

## 2018-01-18 ENCOUNTER — Inpatient Hospital Stay (HOSPITAL_COMMUNITY): Payer: Medicare Other

## 2018-01-18 LAB — COMPREHENSIVE METABOLIC PANEL
ALK PHOS: 54 U/L (ref 38–126)
ALT: 25 U/L (ref 0–44)
ANION GAP: 7 (ref 5–15)
AST: 27 U/L (ref 15–41)
Albumin: 2 g/dL — ABNORMAL LOW (ref 3.5–5.0)
BUN: 21 mg/dL (ref 8–23)
CALCIUM: 7.9 mg/dL — AB (ref 8.9–10.3)
CO2: 21 mmol/L — ABNORMAL LOW (ref 22–32)
Chloride: 112 mmol/L — ABNORMAL HIGH (ref 98–111)
Creatinine, Ser: 0.97 mg/dL (ref 0.61–1.24)
GFR calc Af Amer: >60 mL/min (ref >60)
GFR calc non Af Amer: >60 mL/min (ref >60)
GLUCOSE: 100 mg/dL — AB (ref 70–99)
Potassium: 3.5 mmol/L (ref 3.5–5.1)
Sodium: 140 mmol/L (ref 135–145)
Total Bilirubin: 1.3 mg/dL — ABNORMAL HIGH (ref 0.3–1.2)
Total Protein: 5.2 g/dL — ABNORMAL LOW (ref 6.5–8.1)

## 2018-01-18 LAB — CBC
HEMATOCRIT: 27.9 % — AB (ref 39.0–52.0)
Hemoglobin: 9.1 g/dL — ABNORMAL LOW (ref 13.0–17.0)
MCH: 30.3 pg (ref 26.0–34.0)
MCHC: 32.6 g/dL (ref 30.0–36.0)
MCV: 93 fL (ref 80.0–100.0)
Platelets: 297 10*3/uL (ref 150–400)
RBC: 3 MIL/uL — ABNORMAL LOW (ref 4.22–5.81)
RDW: 14.9 % (ref 11.5–15.5)
WBC: 7.8 10*3/uL (ref 4.0–10.5)
nRBC: 0 % (ref 0.0–0.2)

## 2018-01-18 MED ORDER — KCL IN DEXTROSE-NACL 20-5-0.9 MEQ/L-%-% IV SOLN
INTRAVENOUS | Status: DC
  Administered 2018-01-18 – 2018-01-19 (×2): via INTRAVENOUS

## 2018-01-18 MED ORDER — GLYCOPYRROLATE 0.2 MG/ML IJ SOLN
0.2000 mg | Freq: Once | INTRAMUSCULAR | Status: AC
  Administered 2018-01-19: 0.2 mg via INTRAVENOUS
  Filled 2018-01-18: qty 1

## 2018-01-18 MED ORDER — HALOPERIDOL LACTATE 5 MG/ML IJ SOLN
2.0000 mg | Freq: Four times a day (QID) | INTRAMUSCULAR | Status: DC | PRN
  Administered 2018-01-18 – 2018-01-19 (×2): 2 mg via INTRAVENOUS
  Filled 2018-01-18 (×2): qty 1

## 2018-01-18 NOTE — Progress Notes (Addendum)
Patient ntsx obtained little to nothing even though he has wet cough. Patient appeared less likely to have wet cough when his sedation used morphine ?? Morphine can also be nebulized in Pall[facility] , comfort care.

## 2018-01-18 NOTE — Progress Notes (Signed)
Suctioned patient earlier at nursing request, Obtained very little from back of throat and passing into lungs. No other problems noted with patient other than his dementia and that his respirations are fast at times. Patient is back on oxygen.

## 2018-01-18 NOTE — Progress Notes (Signed)
PROGRESS NOTE  Thomas Foley ZOX:[id number] DOB: [birth year] DOA: 01/12/2018 PCP: Avon Gully, MD  Brief History: 72 year old male with history of severe dementia, COPD brought to the ED after unwitnessed fall. He is a resident of a care home. At baseline reportedly he is ambulatory but confused and requiring assistance with all his ADLs. History very limited. In the ED vitals were stable. Blood work was unremarkable. He fell on the floor in the ED. X-rays and head CT negative for any fracture, showed posterior scalp hematoma and chronic small ischemic vessel changes. Patient placed in observation for further management and physical therapy evaluation. During his hospitalization, the patient had intermittent episodes of hospital delirium with agitation. He required Haldol and Ativan. Unfortunately, the patient sustained a mechanical fall on the morning of 01/14/2018. CT of the head showed new small subdural hemorrhage measuring 6 mm in the right temporal lobe. There is a small amount of traumatic subarachnoid hemorrhage in the adjacent sulci. There is a trace subdural hemorrhage in the lateral temporal lobe on the left. There is also a nondisplaced mandibular fracture on the right frontal temporal bone as well as the right zygomatic arch. There was a nondisplaced fracture of the lateral wall of the left orbit and left maxillary sinus. The case was discussed with neurosurgery and ENT trauma. They both recommended conservative, nonoperative management. Unfortunately, the patient's medical condition declined on 01/16/2018. The patient was noted to have dyspnea. Work-up revealed the patient had atrial fibrillation with RVR as well as healthcare associated pneumonia. The patient was transferred to the ICU. As expected, his mental status worsened with his clinical decline.  Assessment/Plan: Aspiration pneumonia/healthcare associated pneumonia -Continue Zosyn -Personally reviewed  chest x-ray--increasing right>Llower lobe opacity -Reconsulted speech therapy for repeat evaluation-->NPO  Paroxysmal atrial fibrillation with RVR -Appreciate cardiology consult-->not a good candidate for anticoagulation due to SDH, SAH -Not a candidate for aggressive measures -Focus on heart rate control -CHADVASc = 2 -spontaneously converted back to sinus with PACs 12/7 -continue diltiazem drip as pt not safe for po intake  Subdural hematoma/subarachnoid hemorrhage -01/14/2018 CT brain--subdural hemorrhage and subarachnoid hemorrhage as discussed above -Case was discussed with neurosurgery(Dr. Elsner)and ENT trauma-->recommend nonoperative, conservative management -Discontinue enoxaparin -Place SCDs  Acute metabolic encephalopathy -due to infection, medications, SDH -minimize sedation -d/c ativan -d/c morphine -haldol for severe agitation -repeat CT brain  Facial fractures/maxillary sinus fracture -Secondary to mechanical fall -Case discussed with ENT trauma(Dr. Thayer Ohm Newman)-->conservative management,nonoperative management  Gait Instability/Frequent Falls -TSH 0.677 -Urinalysis negative for pyuria -Magnesium 1.9 -Serum B12 149 -RBC folate--1896 -CK--893>>>397  Mild rhabdomyolysis-traumatic -[id number]>>>[id number]>>397 -StartedIV fluids  B12 deficiency -Supplement IM during hospitalization -Discharged with oral supplementation -Repeat serum B12 in 1 month  Hypokalemia -Repleted -Magnesium 1.9  Cognitive impairment/dementia -disContinue olanzapine at bedtime and Cogentin -Haldol as needed agitation -Patient is at risk for hospital delirium -Patient has episodes of impulsiveness-->patient had mechanical fall a.m. 01/14/2018  Dehydration -Continue IVF during the hospitalization  Stage I pressure injury--sacrum -Local wound care -Not infected on exam -Present prior to admission  Goals of care -Palliative medicine has been consulted -The  patient is a ward of the state -The patient's overall prognosis is a very poor -I suspect he may not survive this hospitalization -Numerous medical staff and ancillary staff have tried to contact the patient's social worker at social services of [place]--messages have been left on voicemail without return calls -I personally spoke Catering manager of [facility] [employer] [facility] [place]-->filled out appropriate paperwork for DNR -If no clinical improvement, will need to discuss comfort care measures with DSS   Disposition Plan:remain in stepdown Family Communication:no return calls from guardian;  I insisted upon speaking with DSS director 12/6  Consultants:Cardiology, palliative medicine  Code Status: FULL  DVT Prophylaxis: SCDs   Procedures: As Listed in Progress Note Above  Antibiotics: Zosyn 12/6>>     Subjective: Pt is somnolent.  Grimaces to protopathic stimuli.  No vomting, diarrhea, respiratory distress.  No signs of uncontrolled pain  Objective: Vitals:   01/18/18 1200 01/18/18 1215 01/18/18 1230 01/18/18 1245  BP: 106/74 107/66 (!) 152/70 119/78  Pulse: 79 75 91 83  Resp: (!) 29 (!) 26 (!) 24 (!) 27  Temp:      TempSrc:      SpO2: 100% 99% 98% 99%  Weight:      Height:        Intake/Output Summary (Last 24 hours) at 01/18/2018 1252 Last data filed at 01/18/2018 9604 Gross per 24 hour  Intake 1399.27 ml  Output 1400 ml  Net -0.73 ml   Weight change:  Exam:   General:  Pt is somnolent, does not follow commands appropriately, not in acute distress  HEENT: No icterus, No thrush, No neck mass, [place]/AT  Cardiovascular: RRR, S1/S2, no rubs, no gallops  Respiratory: bibasilar rales  Abdomen: Soft/+BS, non tender, non distended, no guarding  Extremities: No edema, No lymphangitis, No petechiae, No rashes, no synovitis   Data Reviewed: I have personally reviewed following labs and imaging studies Basic Metabolic  Panel: Recent Labs  Lab 01/13/18 0421 01/14/18 1215 01/15/18 0502 01/16/18 1033 01/17/18 0529 01/17/18 0530 01/18/18 0408  NA 139 137 136 136  --  139 140  K 3.7 4.1 3.6 3.7  --  3.8 3.5  CL 110 105 105 108  --  110 112*  CO2 24 24 22  15*  --  19* 21*  GLUCOSE 87 101* 119* 89  --  113* 100*  BUN 17 10 10 19   --  25* 21  CREATININE 1.08 1.04 0.87 1.26*  --  1.17 0.97  CALCIUM 7.9* 8.3* 8.1* 8.1*  --  8.1* 7.9*  MG 1.9 1.9  --   --  1.9  --   --    Liver Function Tests: Recent Labs  Lab 01/17/18 0530 01/18/18 0408  AST 36 27  ALT 27 25  ALKPHOS 58 54  BILITOT 1.8* 1.3*  PROT 5.6* 5.2*  ALBUMIN 2.4* 2.0*   No results for input(s): LIPASE, AMYLASE in the last 168 hours. Recent Labs  Lab 01/17/18 0530  AMMONIA 15   Coagulation Profile: No results for input(s): INR, PROTIME in the last 168 hours. CBC: Recent Labs  Lab 01/12/18 1355 01/13/18 0421 01/15/18 0502 01/16/18 1033 01/17/18 0529 01/18/18 0408  WBC 6.6  --  7.9 8.9 8.9 7.8  NEUTROABS 5.4  --   --   --   --   --   HGB 11.0*  --  11.1* 11.8* 10.4* 9.1*  HCT 33.3* 29.3* 33.8* 36.6* 31.3* 27.9*  MCV 92.5  --  89.9 92.7 89.9 93.0  PLT 298  --  319 326 339 297   Cardiac Enzymes: Recent Labs  Lab 01/14/18 1215 01/15/18 0502 01/16/18 1033  CKTOTAL 893* 786* 397   BNP: Invalid input(s): POCBNP CBG: No results for input(s): GLUCAP in the last 168 hours. HbA1C: No results for input(s): HGBA1C in the last 72 hours. Urine analysis:  Component Value Date/Time   COLORURINE YELLOW 01/12/2018 1327   APPEARANCEUR CLEAR 01/12/2018 1327   LABSPEC 1.025 01/12/2018 1327   PHURINE 6.0 01/12/2018 1327   GLUCOSEU NEGATIVE 01/12/2018 1327   HGBUR NEGATIVE 01/12/2018 1327   BILIRUBINUR NEGATIVE 01/12/2018 1327   KETONESUR 20 (A) 01/12/2018 1327   PROTEINUR NEGATIVE 01/12/2018 1327   NITRITE NEGATIVE 01/12/2018 1327   LEUKOCYTESUR NEGATIVE 01/12/2018 1327   Sepsis  Labs: @LABRCNTIP (procalcitonin:4,lacticidven:4) ) Recent Results (from the past 240 hour(s))  MRSA PCR Screening     Status: None   Collection Time: 01/12/18  9:16 PM  Result Value Ref Range Status   MRSA by PCR NEGATIVE NEGATIVE Final    Comment:        The GeneXpert MRSA Assay (FDA approved for NASAL specimens only), is one component of a comprehensive MRSA colonization surveillance program. It is not intended to diagnose MRSA infection nor to guide or monitor treatment for MRSA infections. Performed at [facility], [address], [place], [place] [address]   MRSA PCR Screening     Status: None   Collection Time: 01/16/18  7:45 PM  Result Value Ref Range Status   MRSA by PCR NEGATIVE NEGATIVE Final    Comment:        The GeneXpert MRSA Assay (FDA approved for NASAL specimens only), is one component of a comprehensive MRSA colonization surveillance program. It is not intended to diagnose MRSA infection nor to guide or monitor treatment for MRSA infections. Performed at [facility], [address] [place], [place] [address]      Scheduled Meds: . chlorhexidine  15 mL Mouth Rinse BID  . cyanocobalamin  1,000 mcg Intramuscular Daily  . folic acid  1 mg Intravenous Daily  . ipratropium-albuterol  3 mL Nebulization Q8H  . mouth rinse  15 mL Mouth Rinse q12n4p  . pantoprazole (PROTONIX) IV  40 mg Intravenous Q24H   Continuous Infusions: . sodium chloride Stopped (01/18/18 0542)  . diltiazem (CARDIZEM) infusion 10 mg/hr (01/18/18 1243)  . piperacillin-tazobactam (ZOSYN)  IV 3.375 g (01/18/18 0542)    Procedures/Studies: Dg Chest 1 View  Result Date: 01/12/2018 CLINICAL DATA:  Fall from chair. EXAM: CHEST  1 VIEW COMPARISON:  Chest x-ray dated 08/12/2013. FINDINGS: Heart size and mediastinal contours are within normal limits. Lungs are clear. No pleural effusion or pneumothorax seen. Osseous structures about the chest are unremarkable. IMPRESSION: No acute findings.   Lungs are clear. Electronically Signed   By: Bary Richard M.D.   On: 01/12/2018 11:44   Ct Head Wo Contrast  Result Date: 01/14/2018 CLINICAL DATA:  Ataxia post head trauma. Found down. EXAM: CT HEAD WITHOUT CONTRAST CT CERVICAL SPINE WITHOUT CONTRAST TECHNIQUE: Multidetector CT imaging of the head and cervical spine was performed following the standard protocol without intravenous contrast. Multiplanar CT image reconstructions of the cervical spine were also generated. COMPARISON:  Head CT dated 01/12/2018. FINDINGS: CT HEAD FINDINGS Brain: New small focus of acute subdural hemorrhage overlying the anterior margin of the lower RIGHT temporal lobe, measuring 6 mm thickness. Small amount of associated traumatic subarachnoid hemorrhage within the adjacent sulci. No appreciable edema within the surrounding parenchyma. No significant mass effect. Additional trace subarachnoid hemorrhage within the sulcus of the superior-lateral LEFT temporal lobe, also without evidence of surrounding parenchymal edema or mass effect. There is generalized age related parenchymal volume loss with commensurate dilatation of the ventricles and sulci. Ventricles are stable in size and configuration. Chronic small vessel ischemic changes again noted  within the deep periventricular white matter regions bilaterally. Vascular: Chronic calcified atherosclerotic changes of the large vessels at the skull base. No unexpected hyperdense vessel. Skull: New nondisplaced fracture within the RIGHT frontotemporal bone. Additional new nondisplaced fracture within the RIGHT zygomatic arch. Additional nondisplaced fracture within the lateral wall of the LEFT orbit, stable appearance compared to the most recent head CT of 01/12/2018. Stable comminuted fracture of the LEFT zygomatic arch. Sinuses/Orbits: Fluid/mucosal thickening within the posterior ethmoid air cells. Orbital globes appear intact and symmetric in configuration. No retro-orbital  hemorrhage. Other: Scalp edema/hematoma overlying the LEFT parietooccipital bone has slightly decreased in size. Incidental note made of a slightly displaced fracture within the posterior wall of the LEFT maxillary sinus, stable compared to the recent head CT. CT CERVICAL SPINE FINDINGS Alignment: Stable dextroscoliosis. No evidence of acute vertebral body subluxation. Skull base and vertebrae: Characterization of osseous detail is slightly limited by patient motion artifact, however, there is no fracture line or displaced fracture fragment seen. Facet joints appear normally aligned. Soft tissues and spinal canal: No prevertebral fluid or swelling. No visible canal hematoma. Disc levels: Degenerative spondylosis throughout the cervical spine, mild to moderate in degree. No more than mild central canal stenosis at any level. Upper chest: No acute findings. Other: None. IMPRESSION: 1. New small focus of acute subdural hemorrhage overlying the anterior margin of the lower RIGHT temporal lobe, measuring 6 mm thickness. Small amount of associated traumatic subarachnoid hemorrhage within the adjacent sulci. No significant mass effect. 2. Additional trace subarachnoid hemorrhage within a sulcus of the superior-lateral LEFT temporal lobe, also without evidence of surrounding parenchymal edema or mass effect. 3. New nondisplaced fracture within the RIGHT frontotemporal bone. 4. New nondisplaced fracture within the RIGHT zygomatic arch. 5. Nondisplaced fracture within the lateral wall of the LEFT orbit, likely acute but stable compared to recent head CT of 01/12/2018. 6. Slightly displaced fracture within the posterior wall of the LEFT maxillary sinus. 7. Scalp edema/hematoma over the LEFT parietooccipital bone, slightly decreased in size compared to the previous study of 01/12/2018. 8. No fracture or acute subluxation within the cervical spine. Critical Value/emergent results were called by telephone at the time of  interpretation on 01/14/2018 at 12:45 pm to Dr. Onalee Hua Giles Currie , who verbally acknowledged these results. Electronically Signed   By: Bary Richard M.D.   On: 01/14/2018 13:00   Ct Head Wo Contrast  Result Date: 01/12/2018 CLINICAL DATA:  Patient found face down on floor. Patient states he was looking for is pants. EXAM: CT HEAD WITHOUT CONTRAST CT CERVICAL SPINE WITHOUT CONTRAST TECHNIQUE: Multidetector CT imaging of the head and cervical spine was performed following the standard protocol without intravenous contrast. Multiplanar CT image reconstructions of the cervical spine were also generated. COMPARISON:  None. FINDINGS: CT HEAD FINDINGS Brain: No evidence of acute infarction, hemorrhage, hydrocephalus, extra-axial collection or mass lesion/mass effect. There is mild diffuse low-attenuation within the subcortical and periventricular white matter compatible with chronic microvascular disease. Prominence of the sulci and ventricles compatible with brain atrophy. Vascular: No hyperdense vessel or unexpected calcification. Skull: Normal. Negative for fracture or focal lesion. Sinuses/Orbits: There is opacification of the right mastoid air cells. Mild mucosal thickening is noted within the left maxillary sinus. The remaining paranasal sinuses and the left mastoid air cells are clear. Other: Left posterior parietal scalp hematoma noted, image 56/5. CT CERVICAL SPINE FINDINGS Alignment: Normal. Skull base and vertebrae: No acute fracture. No primary bone lesion or focal pathologic process. Soft tissues  and spinal canal: No prevertebral fluid or swelling. No visible canal hematoma. Disc levels: Ventral endplate spurring and disc space narrowing identified at C3-4 and C5-6. Upper chest: A chronic appearing deformity involving the left shoulder including the distal clavicle and scapula identified. Other: None IMPRESSION: 1. No acute intracranial abnormalities. 2. Left posterior parietal scalp hematoma. 3. Chronic small  vessel ischemic change and brain atrophy. 4. Right mastoid air cell opacification. 5. No evidence for cervical spine fracture 6. Cervical spine degenerative disc disease. Electronically Signed   By: Signa Kellaylor  Stroud M.D.   On: 01/12/2018 15:23   Ct Cervical Spine Wo Contrast  Result Date: 01/14/2018 CLINICAL DATA:  Ataxia post head trauma. Found down. EXAM: CT HEAD WITHOUT CONTRAST CT CERVICAL SPINE WITHOUT CONTRAST TECHNIQUE: Multidetector CT imaging of the head and cervical spine was performed following the standard protocol without intravenous contrast. Multiplanar CT image reconstructions of the cervical spine were also generated. COMPARISON:  Head CT dated 01/12/2018. FINDINGS: CT HEAD FINDINGS Brain: New small focus of acute subdural hemorrhage overlying the anterior margin of the lower RIGHT temporal lobe, measuring 6 mm thickness. Small amount of associated traumatic subarachnoid hemorrhage within the adjacent sulci. No appreciable edema within the surrounding parenchyma. No significant mass effect. Additional trace subarachnoid hemorrhage within the sulcus of the superior-lateral LEFT temporal lobe, also without evidence of surrounding parenchymal edema or mass effect. There is generalized age related parenchymal volume loss with commensurate dilatation of the ventricles and sulci. Ventricles are stable in size and configuration. Chronic small vessel ischemic changes again noted within the deep periventricular white matter regions bilaterally. Vascular: Chronic calcified atherosclerotic changes of the large vessels at the skull base. No unexpected hyperdense vessel. Skull: New nondisplaced fracture within the RIGHT frontotemporal bone. Additional new nondisplaced fracture within the RIGHT zygomatic arch. Additional nondisplaced fracture within the lateral wall of the LEFT orbit, stable appearance compared to the most recent head CT of 01/12/2018. Stable comminuted fracture of the LEFT zygomatic arch.  Sinuses/Orbits: Fluid/mucosal thickening within the posterior ethmoid air cells. Orbital globes appear intact and symmetric in configuration. No retro-orbital hemorrhage. Other: Scalp edema/hematoma overlying the LEFT parietooccipital bone has slightly decreased in size. Incidental note made of a slightly displaced fracture within the posterior wall of the LEFT maxillary sinus, stable compared to the recent head CT. CT CERVICAL SPINE FINDINGS Alignment: Stable dextroscoliosis. No evidence of acute vertebral body subluxation. Skull base and vertebrae: Characterization of osseous detail is slightly limited by patient motion artifact, however, there is no fracture line or displaced fracture fragment seen. Facet joints appear normally aligned. Soft tissues and spinal canal: No prevertebral fluid or swelling. No visible canal hematoma. Disc levels: Degenerative spondylosis throughout the cervical spine, mild to moderate in degree. No more than mild central canal stenosis at any level. Upper chest: No acute findings. Other: None. IMPRESSION: 1. New small focus of acute subdural hemorrhage overlying the anterior margin of the lower RIGHT temporal lobe, measuring 6 mm thickness. Small amount of associated traumatic subarachnoid hemorrhage within the adjacent sulci. No significant mass effect. 2. Additional trace subarachnoid hemorrhage within a sulcus of the superior-lateral LEFT temporal lobe, also without evidence of surrounding parenchymal edema or mass effect. 3. New nondisplaced fracture within the RIGHT frontotemporal bone. 4. New nondisplaced fracture within the RIGHT zygomatic arch. 5. Nondisplaced fracture within the lateral wall of the LEFT orbit, likely acute but stable compared to recent head CT of 01/12/2018. 6. Slightly displaced fracture within the posterior wall of the LEFT  maxillary sinus. 7. Scalp edema/hematoma over the LEFT parietooccipital bone, slightly decreased in size compared to the previous study  of 01/12/2018. 8. No fracture or acute subluxation within the cervical spine. Critical Value/emergent results were called by telephone at the time of interpretation on 01/14/2018 at 12:45 pm to Dr. Onalee Hua Edwin Baines , who verbally acknowledged these results. Electronically Signed   By: Bary Richard M.D.   On: 01/14/2018 13:00   Ct Cervical Spine Wo Contrast  Result Date: 01/12/2018 CLINICAL DATA:  Patient found face down on floor. Patient states he was looking for is pants. EXAM: CT HEAD WITHOUT CONTRAST CT CERVICAL SPINE WITHOUT CONTRAST TECHNIQUE: Multidetector CT imaging of the head and cervical spine was performed following the standard protocol without intravenous contrast. Multiplanar CT image reconstructions of the cervical spine were also generated. COMPARISON:  None. FINDINGS: CT HEAD FINDINGS Brain: No evidence of acute infarction, hemorrhage, hydrocephalus, extra-axial collection or mass lesion/mass effect. There is mild diffuse low-attenuation within the subcortical and periventricular white matter compatible with chronic microvascular disease. Prominence of the sulci and ventricles compatible with brain atrophy. Vascular: No hyperdense vessel or unexpected calcification. Skull: Normal. Negative for fracture or focal lesion. Sinuses/Orbits: There is opacification of the right mastoid air cells. Mild mucosal thickening is noted within the left maxillary sinus. The remaining paranasal sinuses and the left mastoid air cells are clear. Other: Left posterior parietal scalp hematoma noted, image 56/5. CT CERVICAL SPINE FINDINGS Alignment: Normal. Skull base and vertebrae: No acute fracture. No primary bone lesion or focal pathologic process. Soft tissues and spinal canal: No prevertebral fluid or swelling. No visible canal hematoma. Disc levels: Ventral endplate spurring and disc space narrowing identified at C3-4 and C5-6. Upper chest: A chronic appearing deformity involving the left shoulder including the distal  clavicle and scapula identified. Other: None IMPRESSION: 1. No acute intracranial abnormalities. 2. Left posterior parietal scalp hematoma. 3. Chronic small vessel ischemic change and brain atrophy. 4. Right mastoid air cell opacification. 5. No evidence for cervical spine fracture 6. Cervical spine degenerative disc disease. Electronically Signed   By: Signa Kell M.D.   On: 01/12/2018 15:23   Ct Pelvis Wo Contrast  Result Date: 01/12/2018 CLINICAL DATA:  Fall today from chair.  Negative plain films. EXAM: CT PELVIS WITHOUT CONTRAST TECHNIQUE: Multidetector CT imaging of the pelvis was performed following the standard protocol without intravenous contrast. COMPARISON:  Pelvis/hip x-rays today. FINDINGS: Urinary Tract:  Within normal. Bowel: Mild diverticulosis of the colon. Moderate segment of sigmoid colon extends into a left inguinal hernia down into the left scrotum. No obstruction. Vascular/Lymphatic: Calcified plaque over the abdominal aorta. No adenopathy. Reproductive:  Unremarkable. Other:  Left inguinal hernia as described above.  No free fluid. Musculoskeletal: No significant degenerative changes of the hips. No acute fracture or dislocation. Minimal degenerate change of the spine. IMPRESSION: No acute fracture. Left inguinal hernia containing a moderate segment of colon which extends down into the left Colonic diverticulosis. Aortic Atherosclerosis (ICD10-I70.0). Scrotum. Electronically Signed   By: Elberta Fortis M.D.   On: 01/12/2018 15:24   Dg Chest Port 1 View  Result Date: 01/16/2018 CLINICAL DATA:  Increased shortness of breath. Low-grade fever. Irregular heart rate. EXAM: PORTABLE CHEST 1 VIEW COMPARISON:  Chest x-rays dated 01/12/2018 and 08/12/2013. FINDINGS: Heart size and mediastinal contours are within normal limits. Patchy opacities are seen bilaterally, particularly prominent at the medial aspects of the RIGHT lung base. Coarse interstitial markings bilaterally suggesting  interstitial edema. No pleural effusions  seen. No pneumothorax seen. Chronic appearing deformity of the distal LEFT clavicle. No acute appearing osseous abnormality. IMPRESSION: 1. Patchy opacities bilaterally, particularly prominent at the medial aspects of the RIGHT lung base, concerning for multifocal pneumonia. 2. Coarse interstitial markings suggesting associated interstitial edema. Electronically Signed   By: Bary Richard M.D.   On: 01/16/2018 11:09   Dg Knee Complete 4 Views Left  Result Date: 01/12/2018 CLINICAL DATA:  Fall.  Slid out of a chair.  Initial encounter. EXAM: LEFT KNEE - COMPLETE 4+ VIEW COMPARISON:  None. FINDINGS: There is no evidence of acute fracture, dislocation, or knee joint effusion. No significant arthropathic changes are identified. Atherosclerotic vascular calcifications are noted. IMPRESSION: Negative. Electronically Signed   By: Sebastian Ache M.D.   On: 01/12/2018 11:38   Dg Knee Complete 4 Views Right  Result Date: 01/12/2018 CLINICAL DATA:  Fall from chair EXAM: RIGHT KNEE - COMPLETE 4+ VIEW COMPARISON:  None. FINDINGS: Osseous alignment is normal. No fracture line or displaced fracture fragment seen. No acute or suspicious osseous lesion. No appreciable joint effusion and adjacent soft tissues are unremarkable. IMPRESSION: Negative. Electronically Signed   By: Bary Richard M.D.   On: 01/12/2018 11:43   Dg Hips Bilat W Or Wo Pelvis 2 Views  Result Date: 01/12/2018 CLINICAL DATA:  Fall from chair. EXAM: DG HIP (WITH OR WITHOUT PELVIS) 2V BILAT COMPARISON:  None. FINDINGS: Osseous alignment is normal. No fracture line or displaced fracture fragment seen. No degenerative change at either hip joint. Bowel is noted within the scrotal sac, consistent with large hernia. Soft tissues about the pelvis and hips are otherwise unremarkable. IMPRESSION: 1. No osseous fracture or dislocation. 2. Herniated bowel within the scrotal sac. Electronically Signed   By: Bary Richard M.D.    On: 01/12/2018 11:40    Catarina Hartshorn, DO  [facility] Pager [phone]  If 7PM-7AM, please contact night-coverage [email or web address] Password [email or web address] 01/18/2018, 12:52 PM   LOS: 4 days

## 2018-01-19 DIAGNOSIS — F0391 Unspecified dementia with behavioral disturbance: Secondary | ICD-10-CM

## 2018-01-19 DIAGNOSIS — W19XXXA Unspecified fall, initial encounter: Secondary | ICD-10-CM

## 2018-01-19 DIAGNOSIS — F03918 Unspecified dementia, unspecified severity, with other behavioral disturbance: Secondary | ICD-10-CM

## 2018-01-19 DIAGNOSIS — Z515 Encounter for palliative care: Secondary | ICD-10-CM

## 2018-01-19 LAB — CBC
HCT: 30.6 % — ABNORMAL LOW (ref 39.0–52.0)
HEMOGLOBIN: 9.9 g/dL — AB (ref 13.0–17.0)
MCH: 29.9 pg (ref 26.0–34.0)
MCHC: 32.4 g/dL (ref 30.0–36.0)
MCV: 92.4 fL (ref 80.0–100.0)
Platelets: 303 10*3/uL (ref 150–400)
RBC: 3.31 MIL/uL — ABNORMAL LOW (ref 4.22–5.81)
RDW: 14.7 % (ref 11.5–15.5)
WBC: 7.9 10*3/uL (ref 4.0–10.5)
nRBC: 0 % (ref 0.0–0.2)

## 2018-01-19 LAB — BASIC METABOLIC PANEL
Anion gap: 7 (ref 5–15)
BUN: 13 mg/dL (ref 8–23)
CO2: 26 mmol/L (ref 22–32)
Calcium: 8 mg/dL — ABNORMAL LOW (ref 8.9–10.3)
Chloride: 110 mmol/L (ref 98–111)
Creatinine, Ser: 0.74 mg/dL (ref 0.61–1.24)
GFR calc Af Amer: >60 mL/min (ref >60)
GFR calc non Af Amer: >60 mL/min (ref >60)
Glucose, Bld: 145 mg/dL — ABNORMAL HIGH (ref 70–99)
Potassium: 4.1 mmol/L (ref 3.5–5.1)
Sodium: 143 mmol/L (ref 135–145)

## 2018-01-19 MED ORDER — ALBUTEROL SULFATE (2.5 MG/3ML) 0.083% IN NEBU: 2.5000 mg | INHALATION_SOLUTION | RESPIRATORY_TRACT | Status: DC | PRN

## 2018-01-19 MED ORDER — GLYCOPYRROLATE 0.2 MG/ML IJ SOLN
0.2000 mg | INTRAMUSCULAR | Status: DC | PRN
  Administered 2018-01-20: 0.2 mg via INTRAVENOUS
  Filled 2018-01-19: qty 1

## 2018-01-19 MED ORDER — MORPHINE SULFATE (PF) 2 MG/ML IV SOLN
1.0000 mg | INTRAVENOUS | Status: DC | PRN
  Administered 2018-01-19 – 2018-01-20 (×2): 2 mg via INTRAVENOUS
  Filled 2018-01-19 (×3): qty 1

## 2018-01-19 MED ORDER — BISACODYL 10 MG RE SUPP: 10.0000 mg | Freq: Every day | RECTAL | Status: DC | PRN

## 2018-01-19 MED ORDER — MORPHINE SULFATE (PF) 2 MG/ML IV SOLN
2.0000 mg | Freq: Once | INTRAVENOUS | Status: AC
  Administered 2018-01-19: 2 mg via INTRAVENOUS
  Filled 2018-01-19: qty 1

## 2018-01-19 MED ORDER — MORPHINE SULFATE (PF) 2 MG/ML IV SOLN: 1.0000 mg | INTRAVENOUS | Status: DC | PRN

## 2018-01-19 MED ORDER — LORAZEPAM 2 MG/ML IJ SOLN
0.5000 mg | INTRAMUSCULAR | Status: DC | PRN
  Administered 2018-01-19 – 2018-01-20 (×4): 0.5 mg via INTRAVENOUS
  Filled 2018-01-19 (×3): qty 1

## 2018-01-19 MED ORDER — LORAZEPAM 2 MG/ML IJ SOLN
INTRAMUSCULAR | Status: AC
  Administered 2018-01-19: 0.5 mg via INTRAVENOUS
  Filled 2018-01-19: qty 1

## 2018-01-19 NOTE — Progress Notes (Signed)
PT Cancellation Note  Patient Details Name: Thomas JarvisJames Foley MRN: [id number] DOB: 06/04/1945   Cancelled Treatment:    Reason Eval/Treat Not Completed: Medical issues which prohibited therapy.  Patient transferred to a higher level of care and will need new PT consult resume therapy when patient is medically stable.  Thank you.   7:53 AM, 01/19/18 Ocie BobJames Aeric Burnham, MPT Physical Therapist with [facility] [phone] office [phone] mobile phone

## 2018-01-19 NOTE — Consult Note (Signed)
Consultation Note Date: 01/19/2018   Patient Name: Thomas Foley  DOB: 1945/08/20  MRN: [id number]  Age / Sex: 72 y.o., male  PCP: Avon Gully, MD Referring Physician: Catarina Hartshorn, MD  Reason for Consultation: Establishing goals of care and Terminal Care  HPI/Patient Profile: 72 y.o. male  with past medical history of dementia, COPD, and GERD admitted on 01/12/2018 after unwitnessed fall from group home. Baseline, patient ambulatory but confused and requiring assistance with ADL's. Course of hospitalization has been complicated by mechanical fall morning of 01/14/18. CT head revealed new small subdural hemorrhage measuring 6mm in right temporal lobe and trace subdural hemorrhage in lateral temporal lobe on left. Also nondisplaced mandibular fracture right frontal temporal bone and right zygomatic arch and nondisplaced fracture of lateral wall of left orbit and left maxillary sinus. Neurosurgery and ENT recommending conservative, non-operative management. Patient clinically declined on 12/6 found to be in afib RVR and with healthcare associated pneumonia. Transferred to SDU/ICU. Patient has legal guardian through Thomas Foley. Palliative medicine consultation for goals of care/terminal care.   Clinical Assessment and Goals of Care:  PMT NP attempted to call Thomas Foley last week x2 for GOC discussion. Patient clinically declined through the weekend. Today, he will open eyes to my voice but disoriented and not following commands. Intermittently agitated requiring mitts.   Spoke with legal guardian, Thomas Foley via telephone.   I introduced Palliative Medicine as specialized medical care for people living with serious illness. It focuses on providing relief from the symptoms and stress of a serious illness.  We discussed a brief life review of the patient. Thomas Foley has been living at group home for about 12 years. Per  Thomas Foley, he has no family. Guardianship was granted with Foley September 2019. Prior to admission, patient living in care home and ambulatory but with baseline mild dementia. Frequent falls.   Discussed events leading up to hospitalization and course of hospitalization including diagnoses, interventions, and poor prognosis. The natural disease trajectory of dementia was discussed. Educated on medical team recommendation against heroic measures including feeding tube with underlying dementia, frailty, and complications that can occur with feeding tubes in dementia patients including infection, aspiration, and dislodgment with agitation. Explained inability to anticoagulate due to falls/small amount of bleeding and with high risk for stroke with afib.  Educated on medical team recommendation for comfort measures and hospice referral with underlying poor prognosis and lack of improvement despite aggressive medical interventions.   During my conversation, placed on hold for Thomas Foley to speak with her supervisor. Thomas Foley confirms code status change to DNR on Friday per her supervisors conversation with Thomas Foley. She speaks of how quickly Thomas Foley' condition has changed but wanting "what's best for him." She shares that her and supervisor agree that he is already a DNR and placing a feeding tube would be contradictory. She speaks of "continue to make him comfortable." Thomas Foley does request to receive a second opinion before making final decision regarding comfort measures/hospice. I reassured her I would have Thomas Foley call and discuss  with her recommendations.   **Discussed with Thomas Foley. Foley guardian/superivisor agree with transition to comfort measures and hospice referral.   I did follow-up with Thomas Foley via telephone to further educate on comfort measures including goal of comfort, quality, and dignity at EOL. Educated on medications to ensure relief from pain and suffering. Explained that interventions not aimed at comfort  will be discontinued. Explained that social work was consulted and will reach out to her regarding hospice facility placement. Thomas Foley confirms plan for shift to comfort and her understanding of plan of care. Thomas Foley has PMT contact information and understands to call with questions or concerns.   Thomas Foley gives me permission to call Thomas Foley from group home and further update her on diagnoses, interventions, prognosis and plan of care. Spoke with Thomas Foley and answered all questions regarding plan of care. Thomas Foley plans to visit with Thomas Foley this afternoon. Emotional support provided.    SUMMARY OF RECOMMENDATIONS    Thomas Foley and PMT provider have spoken with Foley legal guardian Thomas Foley) regarding diagnoses, interventions, and poor prognosis.   DNR/DNI. NO feeding tube.  Decision has been made for shift to comfort measures only. SW consulted for hospice facility placement. Guardian understands interventions not aimed at comfort will be discontinued.   Comfort feeds per patient request.   Symptom management--see below.   Code Status/Advance Care Planning:  DNR  Symptom Management:   Morphine 1-2mg  IV q2h prn pain/dyspnea/air hunger  Robinul 0.2mg  IV q4h prn secretions  Haldol 2mg  IV q6h prn agitation  Dulcolax suppository daily prn  Palliative Prophylaxis:   Aspiration, Delirium Protocol, Frequent Pain Assessment, Oral Care and Turn Reposition  Additional Recommendations (Limitations, Scope, Preferences):  Full Comfort Care  Psycho-social/Spiritual:   Desire for further Chaplaincy support: yes  Additional Recommendations: Caregiving  Support/Resources, Compassionate Wean Education and Education on Hospice  Prognosis:   < 2 weeks  Discharge Planning: Hospice facility      Primary Diagnoses: Present on Admission: **None**   I have reviewed the medical record, interviewed the patient and family, and examined the patient. The following aspects are  pertinent.  Past Medical History:  Diagnosis Date  . COPD (chronic obstructive pulmonary disease) (HCC)   . GERD (gastroesophageal reflux disease)    Social History   Socioeconomic History  . Marital status: Single    Spouse name: Not on file  . Number of children: Not on file  . Years of education: Not on file  . Highest education level: Not on file  Occupational History  . Not on file  Social Needs  . Financial resource strain: Not on file  . Food insecurity:    Worry: Not on file    Inability: Not on file  . Transportation needs:    Medical: Not on file    Non-medical: Not on file  Tobacco Use  . Smoking status: Current Some Day Smoker  . Smokeless tobacco: Former Engineer, water and Sexual Activity  . Alcohol use: No  . Drug use: No  . Sexual activity: Not on file  Lifestyle  . Physical activity:    Days per week: Not on file    Minutes per session: Not on file  . Stress: Not on file  Relationships  . Social connections:    Talks on phone: Not on file    Gets together: Not on file    Attends religious service: Not on file    Active member of club or organization: Not on file  Attends meetings of clubs or organizations: Not on file    Relationship status: Not on file  Other Topics Concern  . Not on file  Social History Narrative  . Not on file   No family history on file. Scheduled Meds: . chlorhexidine  15 mL Mouth Rinse BID  . mouth rinse  15 mL Mouth Rinse q12n4p   Continuous Infusions: . sodium chloride 10 mL/hr at 01/18/18 0943   PRN Meds:.sodium chloride, acetaminophen **OR** acetaminophen, albuterol, bisacodyl, glycopyrrolate, haloperidol lactate, morphine injection, ondansetron **OR** ondansetron (ZOFRAN) IV Medications Prior to Admission:  Prior to Admission medications   Medication Sig Start Date End Date Taking? Authorizing Provider  albuterol (PROVENTIL HFA;VENTOLIN HFA) 108 (90 Base) MCG/ACT inhaler Inhale 1-2 puffs into the lungs every  6 (six) hours as needed for wheezing or shortness of breath.   Yes [provider]  benztropine (COGENTIN) 1 MG tablet Take 1 tablet by mouth daily. 08/19/14  Yes [provider]  Calcium Carbonate-Vitamin D (CALCIUM 600+D) 600-400 MG-UNIT tablet Take 1 tablet by mouth 3 (three) times daily with meals.    Yes [provider]  folic acid (FOLVITE) 1 MG tablet Take 1 mg by mouth daily.   Yes [provider]  LORazepam (ATIVAN) 0.5 MG tablet Take 0.5 mg by mouth every 8 (eight) hours as needed for anxiety.   Yes [provider]  Melatonin ([employer] MELATONIN) 3 MG TABS Take 3 mg by mouth at bedtime.   Yes [provider]  OLANZapine (ZYPREXA) 5 MG tablet Take 1 tablet by mouth at bedtime. 08/19/14  Yes [provider]  Omega-3 1000 MG CAPS Take 1 capsule by mouth 2 (two) times daily.   Yes [provider]  omeprazole (PRILOSEC) 20 MG capsule Take 20 mg by mouth daily.   Yes [provider]  acetaminophen (TYLENOL) 500 MG tablet Take 2 tablets (1,000 mg total) by mouth every 6 (six) hours as needed for mild pain or moderate pain. 01/14/18   Catarina Hartshorn, MD  metoprolol tartrate (LOPRESSOR) 25 MG tablet Take 0.5 tablets (12.5 mg total) by mouth 2 (two) times daily. 01/15/18   Catarina Hartshorn, MD  vitamin B-12 (CYANOCOBALAMIN) 500 MCG tablet Take 1 tablet (500 mcg total) by mouth daily. 01/14/18   Catarina Hartshorn, MD   No Known Allergies Review of Systems  Unable to perform ROS: Acuity of condition   Physical Exam  Constitutional: He is easily aroused. He appears ill.  HENT:  Head: Normocephalic and atraumatic.  Cardiovascular: An irregularly irregular rhythm present.  Pulmonary/Chest: No accessory muscle usage. No tachypnea. No respiratory distress. He has decreased breath sounds.  Abdominal: There is no tenderness.  Neurological: He is easily aroused.  Drowsy, disoriented, not following commands  Skin: Skin is warm and dry. Abrasion  noted. There is pallor.  Psychiatric: His speech is delayed. Cognition and memory are impaired. He is inattentive.  Nursing note and vitals reviewed.   Vital Signs: BP 122/74   Pulse 71   Temp 98.2 F (36.8 C) (Axillary)   Resp 20   Ht 6' (1.829 m)   Wt 59 kg   SpO2 100%   BMI 17.64 kg/m  Pain Scale: PAINAD POSS *See Group Information*: 1-Acceptable,Awake and alert Pain Score: Asleep   SpO2: SpO2: 100 % O2 Device:SpO2: 100 % O2 Flow Rate: .O2 Flow Rate (L/min): 2 L/min  IO: Intake/output summary:   Intake/Output Summary (Last 24 hours) at 01/19/2018 1228 Last data filed at 01/19/2018 0800 Gross  per 24 hour  Intake 1283.25 ml  Output 1675 ml  Net -391.75 ml    LBM: Last BM Date: 01/16/18 Baseline Weight: Weight: 60.5 kg Most recent weight: Weight: 59 kg     Palliative Assessment/Data: PPS 20%   Flowsheet Rows     Most Recent Value  Intake Tab  Referral Department  Hospitalist  Unit at Time of Referral  ICU  Palliative Care Primary Diagnosis  Neurology  Palliative Care Type  New Palliative care  Date first seen by Palliative Care  01/14/18  Clinical Assessment  Palliative Performance Scale Score  20%  Psychosocial & Spiritual Assessment  Palliative Care Outcomes  Patient/Family meeting held?  Yes  Who was at the meeting?  spoke with Foley legal guardian  Palliative Care Outcomes  Clarified goals of care, Counseled regarding hospice, Provided end of life care assistance, Improved pain interventions, Improved non-pain symptom therapy, Provided psychosocial or spiritual support, Changed to focus on comfort, Transitioned to hospice, ACP counseling assistance      Time In/Out: 0900-0940, 1130-1210 Time Total: 80min Greater than 50%  of this time was spent counseling and coordinating care related to the above assessment and plan.  Signed by:  Vennie HomansMegan Arnetra Terris, FNP-C Palliative Medicine Team  Phone: [phone] Fax: [phone]]   Please contact Palliative  Medicine Team phone at [phone] for questions and concerns.  For individual provider: See Loretha StaplerAmion

## 2018-01-19 NOTE — Progress Notes (Signed)
PROGRESS NOTE  Pilar JarvisJames Tieszen JJK:[id number] DOB: 07/28/1945 DOA: 01/12/2018 PCP: Avon GullyFanta, Tesfaye, MD  Brief History: 72 year old male with history of severe dementia, COPD brought to the ED after unwitnessed fall. He is a resident of a care home. At baseline reportedly he is ambulatory but confused and requiring assistance with all his ADLs. History very limited. In the ED vitals were stable. Blood work was unremarkable. He fell on the floor in the ED. X-rays and head CT negative for any fracture, showed posterior scalp hematoma and chronic small ischemic vessel changes. Patient placed in observation for further management and physical therapy evaluation. During his hospitalization, the patient had intermittent episodes of hospital delirium with agitation. He required Haldol and Ativan. Unfortunately, the patient sustained a mechanical fall on the morning of 01/14/2018. CT of the head showed new small subdural hemorrhage measuring 6 mm in the right temporal lobe. There is a small amount of traumatic subarachnoid hemorrhage in the adjacent sulci. There is a trace subdural hemorrhage in the lateral temporal lobe on the left. There is also a nondisplaced mandibular fracture on the right frontal temporal bone as well as the right zygomatic arch. There was a nondisplaced fracture of the lateral wall of the left orbit and left maxillary sinus. The case was discussed with neurosurgery and ENT trauma. They both recommended conservative, nonoperative management. Unfortunately, the patient's medical condition declined on 01/16/2018. The patient was noted to have dyspnea. Work-up revealed the patient had atrial fibrillation with RVR as well as healthcare associated pneumonia. The patient was transferred to the ICU. As expected, his mental status worsened with his clinical decline. On 01/19/18, I spoke again with Alisia FerrariBailey Sively, the patient's guardian.  I discussed the patient's patient's  overall poor medical condition. I spoke of the patient's mental status continuing to decline.  I also discussed the patient's poor prognosis.  I told [name] that it is unlikely that the patient will return to any level of functioning to maintain any semblence of quality of life.  Certainly, he will not likely return to his premorbid state.  After this discussion, we agreed that it is in the patient's best interest to transition his focus of care to that focusing only his comfort.  She also agreed to a hospice referral which was also made.   All medications with curative intent were subsequently discontinued.  Assessment/Plan: Aspiration pneumonia/healthcare associated pneumonia -ContinueZosyn -Personally reviewed chest x-ray--increasing right>Llower lobe opacity -Reconsulted speech therapy for repeat evaluation-->NPO -no longer active issue as pt's focus of care is transitioning to focus on his full comfort  Paroxysmal atrial fibrillation with RVR -Appreciate cardiology consult-->not a good candidate for anticoagulation due to SDH, SAH -Not a candidate for aggressive measures -Focus on heart rate control -CHADVASc = 2 -no longer active issue as pt's focus of care is transitioning to focus on his full comfort -spontaneously converted back to sinus with PACs 12/7 -continuediltiazem drip as pt not safe for po intake -no longer active issue as pt's focus of care is transitioning to focus on his full comfort  Subdural hematoma/subarachnoid hemorrhage -01/14/2018 CT brain--subdural hemorrhage and subarachnoid hemorrhage as discussed above -Case was discussed with neurosurgery(Dr. Elsner)and ENT trauma-->recommend nonoperative, conservative management -Discontinue enoxaparin -Place SCDs -no longer active issue as pt's focus of care is transitioning to focus on his full comfort  Acute metabolic encephalopathy -due to infection, medications, SDH -minimize sedation -d/c ativan -d/c  morphine -haldol for severe agitation -repeat  CT brain--essentially unchanged except for slight increase in edema without shift  Facial fractures/maxillary sinus fracture -Secondary to mechanical fall -Case discussed with ENT trauma(Dr. Thayer Ohm Newman)-->conservative management,nonoperative management  Gait Instability/Frequent Falls -TSH 0.677 -Urinalysis negative for pyuria -Magnesium 1.9 -Serum B12 149 -RBC folate--1896 -CK--893>>>397  Mild rhabdomyolysis-traumatic -CPK893>>>786>>397 -StartedIV fluids  B12 deficiency -Supplement IM during hospitalization -Discharged with oral supplementation -Repeat serum B12 in 1 month  Hypokalemia -Repleted -Magnesium 1.9  Cognitive impairment/dementia -disContinue olanzapine at bedtime and Cogentin -Haldol as needed agitation -Patient is at risk for hospital delirium -Patient has episodes of impulsiveness-->patient had mechanical fall a.m. 01/14/2018  Dehydration -ContinueIVF during the hospitalization  Stage I pressure injury--sacrum -Local wound care -Not infected on exam -Present prior to admission  Goals of care -Palliative medicine has been consulted -The patient is a ward of the state -The patient's overall prognosis is a very poor -I suspect he may not survive this hospitalization -Numerous medical staff and ancillary staff have tried to contact the patient's social worker at social services of [place]--messages have been left on voicemail without return calls -I [employer] of [employer] [place]-->filled out appropriate paperwork for DNR -spoke again with [name], the patient's guardian.  I discussed the patient's patient's overall poor medical condition. I spoke of the patient's mental status continuing to decline.  I also discussed the patient's poor prognosis.  I told [name] that it is unlikely that the patient will return to any level of  functioning to maintain any semblence of quality of life.  Certainly, he will not likely return to his premorbid state.  After this discussion, we agreed that it is in the patient's best interest to transition his focus of care to that focusing only his comfort.  She also agreed to a hospice referral which was also made.   All medications with curative intent were subsequently discontinued.   Disposition Plan:residential hospice vs in-hospital death Family Communication:spoke with guardian, [name] on 12/9  Consultants:Cardiology, palliative medicine  Code Status: FULL  DVT Prophylaxis: SCDs   Procedures: As Listed in Progress Note Above  Antibiotics: Zosyn 12/6>>12/9  Total time spent 35 minutes.  Greater than 50% spent face to face counseling and coordinating care.     Subjective: Pt moans in pain.  Does not follow commands.  Had agitation and pain issues overnight.  No vomiting or respiratory distress  Objective: Vitals:   01/19/18 0730 01/19/18 0745 01/19/18 0800 01/19/18 0829  BP: 132/68 111/66 122/74   Pulse: 76 71 71   Resp: (!) 25 20 20    Temp:    98.2 F (36.8 C)  TempSrc:    Axillary  SpO2: 100% 100% 100%   Weight:      Height:        Intake/Output Summary (Last 24 hours) at 01/19/2018 1224 Last data filed at 01/19/2018 0800 Gross per 24 hour  Intake 1283.25 ml  Output 1675 ml  Net -391.75 ml   Weight change:  Exam:   General:  Pt is somnolent and moans, does not follow commands appropriately, not in acute distress  HEENT: No icterus, No thrush, No neck mass, [place]/AT  Cardiovascular: RRR, S1/S2, no rubs, no gallops  Respiratory:bibasilar rales  Abdomen: Soft/+BS, non tender, non distended, no guarding  Extremities: No edema, No lymphangitis, No petechiae, No rashes, no synovitis   Data Reviewed: I have personally reviewed following labs and imaging studies Basic Metabolic Panel: Recent Labs  Lab 01/13/18 0421 01/14/18 1215  01/15/18 0502 01/16/18 1033 01/17/18 0529 01/17/18 0530 01/18/18 0408 01/19/18 0426  NA 139 137 136 136  --  139 140 143  K 3.7 4.1 3.6 3.7  --  3.8 3.5 4.1  CL 110 105 105 108  --  110 112* 110  CO2 24 24 22  15*  --  19* 21* 26  GLUCOSE 87 101* 119* 89  --  113* 100* 145*  BUN 17 10 10 19   --  25* 21 13  CREATININE 1.08 1.04 0.87 1.26*  --  1.17 0.97 0.74  CALCIUM 7.9* 8.3* 8.1* 8.1*  --  8.1* 7.9* 8.0*  MG 1.9 1.9  --   --  1.9  --   --   --    Liver Function Tests: Recent Labs  Lab 01/17/18 0530 01/18/18 0408  AST 36 27  ALT 27 25  ALKPHOS 58 54  BILITOT 1.8* 1.3*  PROT 5.6* 5.2*  ALBUMIN 2.4* 2.0*   No results for input(s): LIPASE, AMYLASE in the last 168 hours. Recent Labs  Lab 01/17/18 0530  AMMONIA 15   Coagulation Profile: No results for input(s): INR, PROTIME in the last 168 hours. CBC: Recent Labs  Lab 01/12/18 1355  01/15/18 0502 01/16/18 1033 01/17/18 0529 01/18/18 0408 01/19/18 0426  WBC 6.6  --  7.9 8.9 8.9 7.8 7.9  NEUTROABS 5.4  --   --   --   --   --   --   HGB 11.0*  --  11.1* 11.8* 10.4* 9.1* 9.9*  HCT 33.3*   < > 33.8* 36.6* 31.3* 27.9* 30.6*  MCV 92.5  --  89.9 92.7 89.9 93.0 92.4  PLT 298  --  319 326 339 297 303   < > = values in this interval not displayed.   Cardiac Enzymes: Recent Labs  Lab 01/14/18 1215 01/15/18 0502 01/16/18 1033  CKTOTAL 893* 786* 397   BNP: Invalid input(s): POCBNP CBG: No results for input(s): GLUCAP in the last 168 hours. HbA1C: No results for input(s): HGBA1C in the last 72 hours. Urine analysis:    Component Value Date/Time   COLORURINE YELLOW 01/12/2018 1327   APPEARANCEUR CLEAR 01/12/2018 1327   LABSPEC 1.025 01/12/2018 1327   PHURINE 6.0 01/12/2018 1327   GLUCOSEU NEGATIVE 01/12/2018 1327   HGBUR NEGATIVE 01/12/2018 1327   BILIRUBINUR NEGATIVE 01/12/2018 1327   KETONESUR 20 (A) 01/12/2018 1327   PROTEINUR NEGATIVE 01/12/2018 1327   NITRITE NEGATIVE 01/12/2018 1327   LEUKOCYTESUR  NEGATIVE 01/12/2018 1327   Sepsis Labs: @LABRCNTIP (procalcitonin:4,lacticidven:4) ) Recent Results (from the past 240 hour(s))  MRSA PCR Screening     Status: None   Collection Time: 01/12/18  9:16 PM  Result Value Ref Range Status   MRSA by PCR NEGATIVE NEGATIVE Final    Comment:        The GeneXpert MRSA Assay (FDA approved for NASAL specimens only), is one component of a comprehensive MRSA colonization surveillance program. It is not intended to diagnose MRSA infection nor to guide or monitor treatment for MRSA infections. Performed at [facility], [address] [place], [place] [address]   MRSA PCR Screening     Status: None   Collection Time: 01/16/18  7:45 PM  Result Value Ref Range Status   MRSA by PCR NEGATIVE NEGATIVE Final    Comment:        The GeneXpert MRSA Assay (FDA approved for NASAL specimens only), is one component of a comprehensive MRSA colonization surveillance program. It  is not intended to diagnose MRSA infection nor to guide or monitor treatment for MRSA infections. Performed at [facility], [address] [place], [place] [address]      Scheduled Meds: . chlorhexidine  15 mL Mouth Rinse BID  . mouth rinse  15 mL Mouth Rinse q12n4p   Continuous Infusions: . sodium chloride 10 mL/hr at 01/18/18 6045    Procedures/Studies: Dg Chest 1 View  Result Date: 01/12/2018 CLINICAL DATA:  Fall from chair. EXAM: CHEST  1 VIEW COMPARISON:  Chest x-ray dated 08/12/2013. FINDINGS: Heart size and mediastinal contours are within normal limits. Lungs are clear. No pleural effusion or pneumothorax seen. Osseous structures about the chest are unremarkable. IMPRESSION: No acute findings.  Lungs are clear. Electronically Signed   By: Bary Richard M.D.   On: 01/12/2018 11:44   Ct Head Wo Contrast  Result Date: 01/18/2018 CLINICAL DATA:  Followup intracranial hemorrhage EXAM: CT HEAD WITHOUT CONTRAST TECHNIQUE: Contiguous axial images were obtained from the  base of the skull through the vertex without intravenous contrast. COMPARISON:  01/14/2018.  01/12/2018. FINDINGS: Brain: Hemorrhagic contusion of the right temporal tip as seen previously. No evidence of increased bleeding. Slight worsening of edema. No mass effect or shift. No extra-axial collection. Small amount of subarachnoid blood in the sulci of the right occipital region. Cannot rule out minor hemorrhagic contusion. No brain edema seen however. Small amount of blood dependent in the occipital horn of the right lateral ventricle. Brain shows a background pattern of atrophy and chronic small-vessel disease. Old right frontal infarction. Vascular: There is atherosclerotic calcification of the major vessels at the base of the brain. Skull: Nondisplaced fracture of the calvarium at the right anterior middle cranial fossa as seen previously. Bilateral zygomatic arch and left lateral orbital wall fractures as seen previously. Sinuses/Orbits: Chronic opacification of the right sphenoid sinus. Mastoid effusion on the right without evidence of right temporal bone fracture. Other: None IMPRESSION: Right temporal tip hemorrhagic contusion without evidence of increased bleeding. Some increase in the amount of edema in that region. Small amount of subarachnoid blood noted in the sulci of the right occipital region, likely secondary to the previous insult. Small amount of blood dependently in the occipital horn of the right lateral ventricle. Electronically Signed   By: Paulina Fusi M.D.   On: 01/18/2018 15:01   Ct Head Wo Contrast  Result Date: 01/14/2018 CLINICAL DATA:  Ataxia post head trauma. Found down. EXAM: CT HEAD WITHOUT CONTRAST CT CERVICAL SPINE WITHOUT CONTRAST TECHNIQUE: Multidetector CT imaging of the head and cervical spine was performed following the standard protocol without intravenous contrast. Multiplanar CT image reconstructions of the cervical spine were also generated. COMPARISON:  Head CT dated  01/12/2018. FINDINGS: CT HEAD FINDINGS Brain: New small focus of acute subdural hemorrhage overlying the anterior margin of the lower RIGHT temporal lobe, measuring 6 mm thickness. Small amount of associated traumatic subarachnoid hemorrhage within the adjacent sulci. No appreciable edema within the surrounding parenchyma. No significant mass effect. Additional trace subarachnoid hemorrhage within the sulcus of the superior-lateral LEFT temporal lobe, also without evidence of surrounding parenchymal edema or mass effect. There is generalized age related parenchymal volume loss with commensurate dilatation of the ventricles and sulci. Ventricles are stable in size and configuration. Chronic small vessel ischemic changes again noted within the deep periventricular white matter regions bilaterally. Vascular: Chronic calcified atherosclerotic changes of the large vessels at the skull base. No unexpected hyperdense vessel. Skull: New nondisplaced fracture within the  RIGHT frontotemporal bone. Additional new nondisplaced fracture within the RIGHT zygomatic arch. Additional nondisplaced fracture within the lateral wall of the LEFT orbit, stable appearance compared to the most recent head CT of 01/12/2018. Stable comminuted fracture of the LEFT zygomatic arch. Sinuses/Orbits: Fluid/mucosal thickening within the posterior ethmoid air cells. Orbital globes appear intact and symmetric in configuration. No retro-orbital hemorrhage. Other: Scalp edema/hematoma overlying the LEFT parietooccipital bone has slightly decreased in size. Incidental note made of a slightly displaced fracture within the posterior wall of the LEFT maxillary sinus, stable compared to the recent head CT. CT CERVICAL SPINE FINDINGS Alignment: Stable dextroscoliosis. No evidence of acute vertebral body subluxation. Skull base and vertebrae: Characterization of osseous detail is slightly limited by patient motion artifact, however, there is no fracture line  or displaced fracture fragment seen. Facet joints appear normally aligned. Soft tissues and spinal canal: No prevertebral fluid or swelling. No visible canal hematoma. Disc levels: Degenerative spondylosis throughout the cervical spine, mild to moderate in degree. No more than mild central canal stenosis at any level. Upper chest: No acute findings. Other: None. IMPRESSION: 1. New small focus of acute subdural hemorrhage overlying the anterior margin of the lower RIGHT temporal lobe, measuring 6 mm thickness. Small amount of associated traumatic subarachnoid hemorrhage within the adjacent sulci. No significant mass effect. 2. Additional trace subarachnoid hemorrhage within a sulcus of the superior-lateral LEFT temporal lobe, also without evidence of surrounding parenchymal edema or mass effect. 3. New nondisplaced fracture within the RIGHT frontotemporal bone. 4. New nondisplaced fracture within the RIGHT zygomatic arch. 5. Nondisplaced fracture within the lateral wall of the LEFT orbit, likely acute but stable compared to recent head CT of 01/12/2018. 6. Slightly displaced fracture within the posterior wall of the LEFT maxillary sinus. 7. Scalp edema/hematoma over the LEFT parietooccipital bone, slightly decreased in size compared to the previous study of 01/12/2018. 8. No fracture or acute subluxation within the cervical spine. Critical Value/emergent results were called by telephone at the time of interpretation on 01/14/2018 at 12:45 pm to Dr. Onalee Hua Pamelyn Bancroft , who verbally acknowledged these results. Electronically Signed   By: Bary Richard M.D.   On: 01/14/2018 13:00   Ct Head Wo Contrast  Result Date: 01/12/2018 CLINICAL DATA:  Patient found face down on floor. Patient states he was looking for is pants. EXAM: CT HEAD WITHOUT CONTRAST CT CERVICAL SPINE WITHOUT CONTRAST TECHNIQUE: Multidetector CT imaging of the head and cervical spine was performed following the standard protocol without intravenous contrast.  Multiplanar CT image reconstructions of the cervical spine were also generated. COMPARISON:  None. FINDINGS: CT HEAD FINDINGS Brain: No evidence of acute infarction, hemorrhage, hydrocephalus, extra-axial collection or mass lesion/mass effect. There is mild diffuse low-attenuation within the subcortical and periventricular white matter compatible with chronic microvascular disease. Prominence of the sulci and ventricles compatible with brain atrophy. Vascular: No hyperdense vessel or unexpected calcification. Skull: Normal. Negative for fracture or focal lesion. Sinuses/Orbits: There is opacification of the right mastoid air cells. Mild mucosal thickening is noted within the left maxillary sinus. The remaining paranasal sinuses and the left mastoid air cells are clear. Other: Left posterior parietal scalp hematoma noted, image 56/5. CT CERVICAL SPINE FINDINGS Alignment: Normal. Skull base and vertebrae: No acute fracture. No primary bone lesion or focal pathologic process. Soft tissues and spinal canal: No prevertebral fluid or swelling. No visible canal hematoma. Disc levels: Ventral endplate spurring and disc space narrowing identified at C3-4 and C5-6. Upper chest: A chronic appearing  deformity involving the left shoulder including the distal clavicle and scapula identified. Other: None IMPRESSION: 1. No acute intracranial abnormalities. 2. Left posterior parietal scalp hematoma. 3. Chronic small vessel ischemic change and brain atrophy. 4. Right mastoid air cell opacification. 5. No evidence for cervical spine fracture 6. Cervical spine degenerative disc disease. Electronically Signed   By: Signa Kell M.D.   On: 01/12/2018 15:23   Ct Cervical Spine Wo Contrast  Result Date: 01/14/2018 CLINICAL DATA:  Ataxia post head trauma. Found down. EXAM: CT HEAD WITHOUT CONTRAST CT CERVICAL SPINE WITHOUT CONTRAST TECHNIQUE: Multidetector CT imaging of the head and cervical spine was performed following the standard  protocol without intravenous contrast. Multiplanar CT image reconstructions of the cervical spine were also generated. COMPARISON:  Head CT dated 01/12/2018. FINDINGS: CT HEAD FINDINGS Brain: New small focus of acute subdural hemorrhage overlying the anterior margin of the lower RIGHT temporal lobe, measuring 6 mm thickness. Small amount of associated traumatic subarachnoid hemorrhage within the adjacent sulci. No appreciable edema within the surrounding parenchyma. No significant mass effect. Additional trace subarachnoid hemorrhage within the sulcus of the superior-lateral LEFT temporal lobe, also without evidence of surrounding parenchymal edema or mass effect. There is generalized age related parenchymal volume loss with commensurate dilatation of the ventricles and sulci. Ventricles are stable in size and configuration. Chronic small vessel ischemic changes again noted within the deep periventricular white matter regions bilaterally. Vascular: Chronic calcified atherosclerotic changes of the large vessels at the skull base. No unexpected hyperdense vessel. Skull: New nondisplaced fracture within the RIGHT frontotemporal bone. Additional new nondisplaced fracture within the RIGHT zygomatic arch. Additional nondisplaced fracture within the lateral wall of the LEFT orbit, stable appearance compared to the most recent head CT of 01/12/2018. Stable comminuted fracture of the LEFT zygomatic arch. Sinuses/Orbits: Fluid/mucosal thickening within the posterior ethmoid air cells. Orbital globes appear intact and symmetric in configuration. No retro-orbital hemorrhage. Other: Scalp edema/hematoma overlying the LEFT parietooccipital bone has slightly decreased in size. Incidental note made of a slightly displaced fracture within the posterior wall of the LEFT maxillary sinus, stable compared to the recent head CT. CT CERVICAL SPINE FINDINGS Alignment: Stable dextroscoliosis. No evidence of acute vertebral body subluxation.  Skull base and vertebrae: Characterization of osseous detail is slightly limited by patient motion artifact, however, there is no fracture line or displaced fracture fragment seen. Facet joints appear normally aligned. Soft tissues and spinal canal: No prevertebral fluid or swelling. No visible canal hematoma. Disc levels: Degenerative spondylosis throughout the cervical spine, mild to moderate in degree. No more than mild central canal stenosis at any level. Upper chest: No acute findings. Other: None. IMPRESSION: 1. New small focus of acute subdural hemorrhage overlying the anterior margin of the lower RIGHT temporal lobe, measuring 6 mm thickness. Small amount of associated traumatic subarachnoid hemorrhage within the adjacent sulci. No significant mass effect. 2. Additional trace subarachnoid hemorrhage within a sulcus of the superior-lateral LEFT temporal lobe, also without evidence of surrounding parenchymal edema or mass effect. 3. New nondisplaced fracture within the RIGHT frontotemporal bone. 4. New nondisplaced fracture within the RIGHT zygomatic arch. 5. Nondisplaced fracture within the lateral wall of the LEFT orbit, likely acute but stable compared to recent head CT of 01/12/2018. 6. Slightly displaced fracture within the posterior wall of the LEFT maxillary sinus. 7. Scalp edema/hematoma over the LEFT parietooccipital bone, slightly decreased in size compared to the previous study of 01/12/2018. 8. No fracture or acute subluxation within the cervical spine.  Critical Value/emergent results were called by telephone at the time of interpretation on 01/14/2018 at 12:45 pm to Dr. Onalee Hua Zeynep Fantroy , who verbally acknowledged these results. Electronically Signed   By: Bary Richard M.D.   On: 01/14/2018 13:00   Ct Cervical Spine Wo Contrast  Result Date: 01/12/2018 CLINICAL DATA:  Patient found face down on floor. Patient states he was looking for is pants. EXAM: CT HEAD WITHOUT CONTRAST CT CERVICAL SPINE WITHOUT  CONTRAST TECHNIQUE: Multidetector CT imaging of the head and cervical spine was performed following the standard protocol without intravenous contrast. Multiplanar CT image reconstructions of the cervical spine were also generated. COMPARISON:  None. FINDINGS: CT HEAD FINDINGS Brain: No evidence of acute infarction, hemorrhage, hydrocephalus, extra-axial collection or mass lesion/mass effect. There is mild diffuse low-attenuation within the subcortical and periventricular white matter compatible with chronic microvascular disease. Prominence of the sulci and ventricles compatible with brain atrophy. Vascular: No hyperdense vessel or unexpected calcification. Skull: Normal. Negative for fracture or focal lesion. Sinuses/Orbits: There is opacification of the right mastoid air cells. Mild mucosal thickening is noted within the left maxillary sinus. The remaining paranasal sinuses and the left mastoid air cells are clear. Other: Left posterior parietal scalp hematoma noted, image 56/5. CT CERVICAL SPINE FINDINGS Alignment: Normal. Skull base and vertebrae: No acute fracture. No primary bone lesion or focal pathologic process. Soft tissues and spinal canal: No prevertebral fluid or swelling. No visible canal hematoma. Disc levels: Ventral endplate spurring and disc space narrowing identified at C3-4 and C5-6. Upper chest: A chronic appearing deformity involving the left shoulder including the distal clavicle and scapula identified. Other: None IMPRESSION: 1. No acute intracranial abnormalities. 2. Left posterior parietal scalp hematoma. 3. Chronic small vessel ischemic change and brain atrophy. 4. Right mastoid air cell opacification. 5. No evidence for cervical spine fracture 6. Cervical spine degenerative disc disease. Electronically Signed   By: Signa Kell M.D.   On: 01/12/2018 15:23   Ct Pelvis Wo Contrast  Result Date: 01/12/2018 CLINICAL DATA:  Fall today from chair.  Negative plain films. EXAM: CT PELVIS  WITHOUT CONTRAST TECHNIQUE: Multidetector CT imaging of the pelvis was performed following the standard protocol without intravenous contrast. COMPARISON:  Pelvis/hip x-rays today. FINDINGS: Urinary Tract:  Within normal. Bowel: Mild diverticulosis of the colon. Moderate segment of sigmoid colon extends into a left inguinal hernia down into the left scrotum. No obstruction. Vascular/Lymphatic: Calcified plaque over the abdominal aorta. No adenopathy. Reproductive:  Unremarkable. Other:  Left inguinal hernia as described above.  No free fluid. Musculoskeletal: No significant degenerative changes of the hips. No acute fracture or dislocation. Minimal degenerate change of the spine. IMPRESSION: No acute fracture. Left inguinal hernia containing a moderate segment of colon which extends down into the left Colonic diverticulosis. Aortic Atherosclerosis (ICD10-I70.0). Scrotum. Electronically Signed   By: Elberta Fortis M.D.   On: 01/12/2018 15:24   Dg Chest Port 1 View  Result Date: 01/16/2018 CLINICAL DATA:  Increased shortness of breath. Low-grade fever. Irregular heart rate. EXAM: PORTABLE CHEST 1 VIEW COMPARISON:  Chest x-rays dated 01/12/2018 and 08/12/2013. FINDINGS: Heart size and mediastinal contours are within normal limits. Patchy opacities are seen bilaterally, particularly prominent at the medial aspects of the RIGHT lung base. Coarse interstitial markings bilaterally suggesting interstitial edema. No pleural effusions seen. No pneumothorax seen. Chronic appearing deformity of the distal LEFT clavicle. No acute appearing osseous abnormality. IMPRESSION: 1. Patchy opacities bilaterally, particularly prominent at the medial aspects of the RIGHT lung  base, concerning for multifocal pneumonia. 2. Coarse interstitial markings suggesting associated interstitial edema. Electronically Signed   By: Bary Richard M.D.   On: 01/16/2018 11:09   Dg Knee Complete 4 Views Left  Result Date: 01/12/2018 CLINICAL DATA:   Fall.  Slid out of a chair.  Initial encounter. EXAM: LEFT KNEE - COMPLETE 4+ VIEW COMPARISON:  None. FINDINGS: There is no evidence of acute fracture, dislocation, or knee joint effusion. No significant arthropathic changes are identified. Atherosclerotic vascular calcifications are noted. IMPRESSION: Negative. Electronically Signed   By: Sebastian Ache M.D.   On: 01/12/2018 11:38   Dg Knee Complete 4 Views Right  Result Date: 01/12/2018 CLINICAL DATA:  Fall from chair EXAM: RIGHT KNEE - COMPLETE 4+ VIEW COMPARISON:  None. FINDINGS: Osseous alignment is normal. No fracture line or displaced fracture fragment seen. No acute or suspicious osseous lesion. No appreciable joint effusion and adjacent soft tissues are unremarkable. IMPRESSION: Negative. Electronically Signed   By: Bary Richard M.D.   On: 01/12/2018 11:43   Dg Hips Bilat W Or Wo Pelvis 2 Views  Result Date: 01/12/2018 CLINICAL DATA:  Fall from chair. EXAM: DG HIP (WITH OR WITHOUT PELVIS) 2V BILAT COMPARISON:  None. FINDINGS: Osseous alignment is normal. No fracture line or displaced fracture fragment seen. No degenerative change at either hip joint. Bowel is noted within the scrotal sac, consistent with large hernia. Soft tissues about the pelvis and hips are otherwise unremarkable. IMPRESSION: 1. No osseous fracture or dislocation. 2. Herniated bowel within the scrotal sac. Electronically Signed   By: Bary Richard M.D.   On: 01/12/2018 11:40    Catarina Hartshorn, DO  [facility] Pager [phone]  If 7PM-7AM, please contact night-coverage [email or web address] Password [email or web address] 01/19/2018, 12:24 PM   LOS: 5 days

## 2018-01-19 NOTE — Progress Notes (Addendum)
Cleaned patient mouth after given neb treatment  Patient has strong cough and is bring up in his mouth yellow thin secretions. After deep suction yesterday only clear secretions came up. Suspect this yellow thin infection is coming from sinus , nasal drainage stimulating cough. Patient is more relaxed with 2 mg of morphine. Respirations 24 lungs sound decreased to clear with only upper air way noise. Suspect also with infection to sinus area his pain may be more than we realize.

## 2018-01-19 NOTE — Progress Notes (Signed)
Nutrition Brief Note  RD drawn to patient due to low Braden score.   Chart reviewed. Patient has hx of severe dementia. History of falls. Acute decline in status. After discussion with decision maker for pt he is transitioning to comfort care. Hospice referral in process.  No nutrition interventions at this time.   Royann ShiversLynn Gabriel Paulding MS,RD,CSG,LDN Office: [phone] Pager: [phone]

## 2018-01-19 NOTE — Progress Notes (Addendum)
Patient is between DNR and Palliative Goal Status. He is confused breathing from 25 to 40 times a minute. Sounds congested but nothing is suctioned up when he coughs. Will continue with treatments. Continue supporting  Still on 5 lpm. Lung sounds diminished mostly upper airway noise transmitted.

## 2018-01-19 NOTE — Care Management Important Message (Signed)
Important Message  Patient Details  Name: Thomas JarvisJames Foley MRN: [id number] Date of Birth: 08/13/1945   Medicare Important Message Given:    Pt. Unable to sign due to medical condition.   Sloan LeiterHawkins, Monterrio Gerst Smith 01/19/2018, 12:42 PM

## 2018-01-19 NOTE — Progress Notes (Signed)
Patient appears to be in fairly severe pain.  Dr. Don Perkingat's PN reviewed, understand concern about causing worsening AMS, but do want to treat this, quite possibly terminally ill, patient's suffering.  Will order one time dose of 2mg  morphine IV.

## 2018-01-19 NOTE — Clinical Social Work Note (Signed)
Per CSW consult, referral made to [facility].    Matrice Herro, Juleen ChinaHeather D, LCSW

## 2018-01-20 NOTE — Clinical Social Work Note (Addendum)
Cassandra at Hospice advised that she had spoken to guardian and paperwork was being faxed for completion. She stated that patient could discharge.  [facility]. Message left for Tobi BastosBailey Shivley, Guardian, advising of discharge.  LCSW signing off.   Kelcey Korus, Juleen ChinaHeather D, LCSW

## 2018-01-20 NOTE — Care Management Note (Signed)
Case Management Note  Patient Details  Name: Thomas Foley MRN: [id number] Date of Birth: 06/18/1945   If discussed at Long Length of Stay Meetings, dates discussed:  01/20/2018  Additional Comments:  Ithzel Fedorchak, Chrystine OilerSharley Diane, RN 01/20/2018, 12:11 PM

## 2018-01-20 NOTE — Discharge Summary (Signed)
Physician Discharge Summary  Thomas Foley ZOX:[id number] DOB: August 24, 1945 DOA: 01/12/2018  PCP: Avon Gully, MD  Admit date: 01/12/2018 Discharge date: 01/20/2018  Admitted From: ALF Disposition:  [facility]idential [facility]    Discharge Condition: Stable CODE STATUS:FULL COMFORT Diet recommendation: Comfort feeding   Brief/Interim Summary: 72 year old male with history of severe dementia, COPD brought to the ED after unwitnessed fall. He is a resident of a care home. At baseline reportedly he is ambulatory but confused and requiring assistance with all his ADLs. History very limited. In the ED vitals were stable. Blood work was unremarkable. He fell on the floor in the ED. X-rays and head CT negative for any fracture, showed posterior scalp hematoma and chronic small ischemic vessel changes. Patient placed in observation for further management and physical therapy evaluation. During his hospitalization, the patient had intermittent episodes of hospital delirium with agitation. He required Haldol and Ativan. Unfortunately, the patient sustained a mechanical fall on the morning of 01/14/2018. CT of the head showed new small subdural hemorrhage measuring 6 mm in the right temporal lobe. There is a small amount of traumatic subarachnoid hemorrhage in the adjacent sulci. There is a trace subdural hemorrhage in the lateral temporal lobe on the left. There is also a nondisplaced mandibular fracture on the right frontal temporal bone as well as the right zygomatic arch. There was a nondisplaced fracture of the lateral wall of the left orbit and left maxillary sinus. The case was discussed with neurosurgery and ENT trauma. They both recommended conservative, nonoperative management. Unfortunately, the patient's medical condition declined on 01/16/2018. The patient was noted to have dyspnea. Work-up revealed the patient had atrial fibrillation with RVR as well as healthcare associated pneumonia.  The patient was transferred to the ICU. As expected, his mental status worsened with his clinical decline. On 01/19/18, I spoke again with [name], the patient's guardian.  I discussed the patient's patient's overall poor medical condition. I spoke of the patient's mental status continuing to decline.  I also discussed the patient's poor prognosis.  I told [name] that it is unlikely that the patient will return to any level of functioning to maintain any semblence of quality of life.  Certainly, he will not likely return to his premorbid state.  After this discussion, we agreed that it is in the patient's best interest to transition his focus of care to that focusing only his comfort.  She also agreed to a hospice referral which was also made.   All medications with curative intent were subsequently discontinued.  With the assistance  Discharge Diagnoses:   Aspiration pneumonia/healthcare associated pneumonia -ContinueZosyn -Personally reviewed chest x-ray--increasing right>Llower lobe opacity -Reconsulted speech therapy for repeat evaluation-->NPO -no longer active issue as pt's focus of care is transitioning to focus on his full comfort  Paroxysmal atrial fibrillation with RVR -Appreciate cardiology consult-->not a good candidate for anticoagulation due to SDH, SAH -Not a candidate for aggressive measures -Focus on heart rate control -CHADVASc = 2 -no longer active issue as pt's focus of care is transitioning to focus on his full comfort -spontaneously converted back to sinus with PACs 12/7 -continuediltiazem drip as pt not safe for po intake -no longer active issue as pt's focus of care is transitioning to focus on his full comfort  Subdural hematoma/subarachnoid hemorrhage -01/14/2018 CT brain--subdural hemorrhage and subarachnoid hemorrhage as discussed above -Case was discussed with neurosurgery(Dr. Elsner)and ENT trauma-->recommend nonoperative, conservative  management -Discontinue enoxaparin -Place SCDs -no longer active issue as pt's focus of  care is transitioning to focus on his full comfort  Acute metabolic encephalopathy -due to infection, medications, SDH -minimize sedation -d/c ativan -d/c morphine -haldol for severe agitation -repeat CT brain--essentially unchanged except for slight increase in edema without shift  Facial fractures/maxillary sinus fracture -Secondary to mechanical fall -Case discussed with ENT trauma(Dr. Thayer Ohm Newman)-->conservative management,nonoperative management  Gait Instability/Frequent Falls -TSH 0.677 -Urinalysis negative for pyuria -Magnesium 1.9 -Serum B12 149 -RBC folate--1896 -CK--893>>>397  Mild rhabdomyolysis-traumatic -[id number]>>>[id number]>>397 -StartedIV fluids  B12 deficiency -Supplement IM during hospitalization -Discharged with oral supplementation -no longer active issue as pt is transitioned to comfort measures  Hypokalemia -Repleted -Magnesium 1.9  Cognitive impairment/dementia -disContinue olanzapine at bedtime and Cogentin -Haldol as needed agitation -Patient is at risk for hospital delirium -Patient has episodes of impulsiveness-->patient had mechanical fall a.m. 01/14/2018  Dehydration -ContinueIVF during the hospitalization  Stage I pressure injury--sacrum -Local wound care -Not infected on exam -Present prior to admission  Goals of care -Palliative medicine has been consulted -The patient is a ward of the state -The patient's overall prognosis is a very poor -I suspect he may not survive this hospitalization -Numerous medical staff and ancillary staff have tried to contact the patient's social worker at social services of [place]--messages have been left on voicemail without return calls -I [employer] of [employer] [place]-->filled out appropriate paperwork for DNR -spoke again with [name], the patient's guardian.  I discussed the patient's patient's overall poor medical condition. I spoke of the patient's mental status continuing to decline.  I also discussed the patient's poor prognosis.  I told [name] that it is unlikely that the patient will return to any level of functioning to maintain any semblence of quality of life.  Certainly, he will not likely return to his premorbid state.  After this discussion, we agreed that it is in the patient's best interest to transition his focus of care to that focusing only his comfort.  She also agreed to a hospice referral which was also made.   All medications with curative intent were subsequently discontinued.   Discharge Instructions   Allergies as of 01/20/2018   No Known Allergies     Medication List    STOP taking these medications   benztropine 1 MG tablet Commonly known as:  COGENTIN   CALCIUM 600+D 600-400 MG-UNIT tablet Generic drug:  Calcium Carbonate-Vitamin D   [employer] MELATONIN 3 MG Tabs Generic drug:  Melatonin   folic acid 1 MG tablet Commonly known as:  FOLVITE   LORazepam 0.5 MG tablet Commonly known as:  ATIVAN   OLANZapine 5 MG tablet Commonly known as:  ZYPREXA   omeprazole 20 MG capsule Commonly known as:  PRILOSEC     TAKE these medications   acetaminophen 500 MG tablet Commonly known as:  TYLENOL Take 2 tablets (1,000 mg total) by mouth every 6 (six) hours as needed for mild pain or moderate pain. What changed:  when to take this   albuterol 108 (90 Base) MCG/ACT inhaler Commonly known as:  PROVENTIL HFA;VENTOLIN HFA Inhale 1-2 puffs into the lungs every 6 (six) hours as needed for wheezing or shortness of breath.   Omega-3 1000 MG Caps Take 1 capsule by mouth 2 (two) times daily.      Follow-up Information    Schedule an appointment as soon as possible for a visit  with Avon Gully, MD.   Specialty:  Internal Medicine Why:  Call for a recheck appointment within  1  week. Contact information: [address] [place] [address] [phone]          No Known Allergies  Consultations:  Palliative medicine   Procedures/Studies: Dg Chest 1 View  Result Date: 01/12/2018 CLINICAL DATA:  Fall from chair. EXAM: CHEST  1 VIEW COMPARISON:  Chest x-ray dated 08/12/2013. FINDINGS: Heart size and mediastinal contours are within normal limits. Lungs are clear. No pleural effusion or pneumothorax seen. Osseous structures about the chest are unremarkable. IMPRESSION: No acute findings.  Lungs are clear. Electronically Signed   By: Bary Richard M.D.   On: 01/12/2018 11:44   Ct Head Wo Contrast  Result Date: 01/18/2018 CLINICAL DATA:  Followup intracranial hemorrhage EXAM: CT HEAD WITHOUT CONTRAST TECHNIQUE: Contiguous axial images were obtained from the base of the skull through the vertex without intravenous contrast. COMPARISON:  01/14/2018.  01/12/2018. FINDINGS: Brain: Hemorrhagic contusion of the right temporal tip as seen previously. No evidence of increased bleeding. Slight worsening of edema. No mass effect or shift. No extra-axial collection. Small amount of subarachnoid blood in the sulci of the right occipital region. Cannot rule out minor hemorrhagic contusion. No brain edema seen however. Small amount of blood dependent in the occipital horn of the right lateral ventricle. Brain shows a background pattern of atrophy and chronic small-vessel disease. Old right frontal infarction. Vascular: There is atherosclerotic calcification of the major vessels at the base of the brain. Skull: Nondisplaced fracture of the calvarium at the right anterior middle cranial fossa as seen previously. Bilateral zygomatic arch and left lateral orbital wall fractures as seen previously. Sinuses/Orbits: Chronic opacification of the right sphenoid sinus. Mastoid effusion on the right without evidence of right temporal bone fracture. Other: None IMPRESSION: Right temporal  tip hemorrhagic contusion without evidence of increased bleeding. Some increase in the amount of edema in that region. Small amount of subarachnoid blood noted in the sulci of the right occipital region, likely secondary to the previous insult. Small amount of blood dependently in the occipital horn of the right lateral ventricle. Electronically Signed   By: Paulina Fusi M.D.   On: 01/18/2018 15:01   Ct Head Wo Contrast  Result Date: 01/14/2018 CLINICAL DATA:  Ataxia post head trauma. Found down. EXAM: CT HEAD WITHOUT CONTRAST CT CERVICAL SPINE WITHOUT CONTRAST TECHNIQUE: Multidetector CT imaging of the head and cervical spine was performed following the standard protocol without intravenous contrast. Multiplanar CT image reconstructions of the cervical spine were also generated. COMPARISON:  Head CT dated 01/12/2018. FINDINGS: CT HEAD FINDINGS Brain: New small focus of acute subdural hemorrhage overlying the anterior margin of the lower RIGHT temporal lobe, measuring 6 mm thickness. Small amount of associated traumatic subarachnoid hemorrhage within the adjacent sulci. No appreciable edema within the surrounding parenchyma. No significant mass effect. Additional trace subarachnoid hemorrhage within the sulcus of the superior-lateral LEFT temporal lobe, also without evidence of surrounding parenchymal edema or mass effect. There is generalized age related parenchymal volume loss with commensurate dilatation of the ventricles and sulci. Ventricles are stable in size and configuration. Chronic small vessel ischemic changes again noted within the deep periventricular white matter regions bilaterally. Vascular: Chronic calcified atherosclerotic changes of the large vessels at the skull base. No unexpected hyperdense vessel. Skull: New nondisplaced fracture within the RIGHT frontotemporal bone. Additional new nondisplaced fracture within the RIGHT zygomatic arch. Additional nondisplaced fracture within the lateral  wall of the LEFT orbit, stable appearance compared to the most recent head CT of 01/12/2018. Stable  comminuted fracture of the LEFT zygomatic arch. Sinuses/Orbits: Fluid/mucosal thickening within the posterior ethmoid air cells. Orbital globes appear intact and symmetric in configuration. No retro-orbital hemorrhage. Other: Scalp edema/hematoma overlying the LEFT parietooccipital bone has slightly decreased in size. Incidental note made of a slightly displaced fracture within the posterior wall of the LEFT maxillary sinus, stable compared to the recent head CT. CT CERVICAL SPINE FINDINGS Alignment: Stable dextroscoliosis. No evidence of acute vertebral body subluxation. Skull base and vertebrae: Characterization of osseous detail is slightly limited by patient motion artifact, however, there is no fracture line or displaced fracture fragment seen. Facet joints appear normally aligned. Soft tissues and spinal canal: No prevertebral fluid or swelling. No visible canal hematoma. Disc levels: Degenerative spondylosis throughout the cervical spine, mild to moderate in degree. No more than mild central canal stenosis at any level. Upper chest: No acute findings. Other: None. IMPRESSION: 1. New small focus of acute subdural hemorrhage overlying the anterior margin of the lower RIGHT temporal lobe, measuring 6 mm thickness. Small amount of associated traumatic subarachnoid hemorrhage within the adjacent sulci. No significant mass effect. 2. Additional trace subarachnoid hemorrhage within a sulcus of the superior-lateral LEFT temporal lobe, also without evidence of surrounding parenchymal edema or mass effect. 3. New nondisplaced fracture within the RIGHT frontotemporal bone. 4. New nondisplaced fracture within the RIGHT zygomatic arch. 5. Nondisplaced fracture within the lateral wall of the LEFT orbit, likely acute but stable compared to recent head CT of 01/12/2018. 6. Slightly displaced fracture within the posterior wall  of the LEFT maxillary sinus. 7. Scalp edema/hematoma over the LEFT parietooccipital bone, slightly decreased in size compared to the previous study of 01/12/2018. 8. No fracture or acute subluxation within the cervical spine. Critical Value/emergent results were called by telephone at the time of interpretation on 01/14/2018 at 12:45 pm to Dr. Onalee Hua Chalsey Leeth , who verbally acknowledged these results. Electronically Signed   By: Bary Richard M.D.   On: 01/14/2018 13:00   Ct Head Wo Contrast  Result Date: 01/12/2018 CLINICAL DATA:  Patient found face down on floor. Patient states he was looking for is pants. EXAM: CT HEAD WITHOUT CONTRAST CT CERVICAL SPINE WITHOUT CONTRAST TECHNIQUE: Multidetector CT imaging of the head and cervical spine was performed following the standard protocol without intravenous contrast. Multiplanar CT image reconstructions of the cervical spine were also generated. COMPARISON:  None. FINDINGS: CT HEAD FINDINGS Brain: No evidence of acute infarction, hemorrhage, hydrocephalus, extra-axial collection or mass lesion/mass effect. There is mild diffuse low-attenuation within the subcortical and periventricular white matter compatible with chronic microvascular disease. Prominence of the sulci and ventricles compatible with brain atrophy. Vascular: No hyperdense vessel or unexpected calcification. Skull: Normal. Negative for fracture or focal lesion. Sinuses/Orbits: There is opacification of the right mastoid air cells. Mild mucosal thickening is noted within the left maxillary sinus. The remaining paranasal sinuses and the left mastoid air cells are clear. Other: Left posterior parietal scalp hematoma noted, image 56/5. CT CERVICAL SPINE FINDINGS Alignment: Normal. Skull base and vertebrae: No acute fracture. No primary bone lesion or focal pathologic process. Soft tissues and spinal canal: No prevertebral fluid or swelling. No visible canal hematoma. Disc levels: Ventral endplate spurring and  disc space narrowing identified at C3-4 and C5-6. Upper chest: A chronic appearing deformity involving the left shoulder including the distal clavicle and scapula identified. Other: None IMPRESSION: 1. No acute intracranial abnormalities. 2. Left posterior parietal scalp hematoma. 3. Chronic small vessel ischemic change and brain atrophy.  4. Right mastoid air cell opacification. 5. No evidence for cervical spine fracture 6. Cervical spine degenerative disc disease. Electronically Signed   By: Signa Kell M.D.   On: 01/12/2018 15:23   Ct Cervical Spine Wo Contrast  Result Date: 01/14/2018 CLINICAL DATA:  Ataxia post head trauma. Found down. EXAM: CT HEAD WITHOUT CONTRAST CT CERVICAL SPINE WITHOUT CONTRAST TECHNIQUE: Multidetector CT imaging of the head and cervical spine was performed following the standard protocol without intravenous contrast. Multiplanar CT image reconstructions of the cervical spine were also generated. COMPARISON:  Head CT dated 01/12/2018. FINDINGS: CT HEAD FINDINGS Brain: New small focus of acute subdural hemorrhage overlying the anterior margin of the lower RIGHT temporal lobe, measuring 6 mm thickness. Small amount of associated traumatic subarachnoid hemorrhage within the adjacent sulci. No appreciable edema within the surrounding parenchyma. No significant mass effect. Additional trace subarachnoid hemorrhage within the sulcus of the superior-lateral LEFT temporal lobe, also without evidence of surrounding parenchymal edema or mass effect. There is generalized age related parenchymal volume loss with commensurate dilatation of the ventricles and sulci. Ventricles are stable in size and configuration. Chronic small vessel ischemic changes again noted within the deep periventricular white matter regions bilaterally. Vascular: Chronic calcified atherosclerotic changes of the large vessels at the skull base. No unexpected hyperdense vessel. Skull: New nondisplaced fracture within the  RIGHT frontotemporal bone. Additional new nondisplaced fracture within the RIGHT zygomatic arch. Additional nondisplaced fracture within the lateral wall of the LEFT orbit, stable appearance compared to the most recent head CT of 01/12/2018. Stable comminuted fracture of the LEFT zygomatic arch. Sinuses/Orbits: Fluid/mucosal thickening within the posterior ethmoid air cells. Orbital globes appear intact and symmetric in configuration. No retro-orbital hemorrhage. Other: Scalp edema/hematoma overlying the LEFT parietooccipital bone has slightly decreased in size. Incidental note made of a slightly displaced fracture within the posterior wall of the LEFT maxillary sinus, stable compared to the recent head CT. CT CERVICAL SPINE FINDINGS Alignment: Stable dextroscoliosis. No evidence of acute vertebral body subluxation. Skull base and vertebrae: Characterization of osseous detail is slightly limited by patient motion artifact, however, there is no fracture line or displaced fracture fragment seen. Facet joints appear normally aligned. Soft tissues and spinal canal: No prevertebral fluid or swelling. No visible canal hematoma. Disc levels: Degenerative spondylosis throughout the cervical spine, mild to moderate in degree. No more than mild central canal stenosis at any level. Upper chest: No acute findings. Other: None. IMPRESSION: 1. New small focus of acute subdural hemorrhage overlying the anterior margin of the lower RIGHT temporal lobe, measuring 6 mm thickness. Small amount of associated traumatic subarachnoid hemorrhage within the adjacent sulci. No significant mass effect. 2. Additional trace subarachnoid hemorrhage within a sulcus of the superior-lateral LEFT temporal lobe, also without evidence of surrounding parenchymal edema or mass effect. 3. New nondisplaced fracture within the RIGHT frontotemporal bone. 4. New nondisplaced fracture within the RIGHT zygomatic arch. 5. Nondisplaced fracture within the  lateral wall of the LEFT orbit, likely acute but stable compared to recent head CT of 01/12/2018. 6. Slightly displaced fracture within the posterior wall of the LEFT maxillary sinus. 7. Scalp edema/hematoma over the LEFT parietooccipital bone, slightly decreased in size compared to the previous study of 01/12/2018. 8. No fracture or acute subluxation within the cervical spine. Critical Value/emergent results were called by telephone at the time of interpretation on 01/14/2018 at 12:45 pm to Dr. Onalee Hua Sueanne Maniaci , who verbally acknowledged these results. Electronically Signed   By: Bary Richard  M.D.   On: 01/14/2018 13:00   Ct Cervical Spine Wo Contrast  Result Date: 01/12/2018 CLINICAL DATA:  Patient found face down on floor. Patient states he was looking for is pants. EXAM: CT HEAD WITHOUT CONTRAST CT CERVICAL SPINE WITHOUT CONTRAST TECHNIQUE: Multidetector CT imaging of the head and cervical spine was performed following the standard protocol without intravenous contrast. Multiplanar CT image reconstructions of the cervical spine were also generated. COMPARISON:  None. FINDINGS: CT HEAD FINDINGS Brain: No evidence of acute infarction, hemorrhage, hydrocephalus, extra-axial collection or mass lesion/mass effect. There is mild diffuse low-attenuation within the subcortical and periventricular white matter compatible with chronic microvascular disease. Prominence of the sulci and ventricles compatible with brain atrophy. Vascular: No hyperdense vessel or unexpected calcification. Skull: Normal. Negative for fracture or focal lesion. Sinuses/Orbits: There is opacification of the right mastoid air cells. Mild mucosal thickening is noted within the left maxillary sinus. The remaining paranasal sinuses and the left mastoid air cells are clear. Other: Left posterior parietal scalp hematoma noted, image 56/5. CT CERVICAL SPINE FINDINGS Alignment: Normal. Skull base and vertebrae: No acute fracture. No primary bone lesion or  focal pathologic process. Soft tissues and spinal canal: No prevertebral fluid or swelling. No visible canal hematoma. Disc levels: Ventral endplate spurring and disc space narrowing identified at C3-4 and C5-6. Upper chest: A chronic appearing deformity involving the left shoulder including the distal clavicle and scapula identified. Other: None IMPRESSION: 1. No acute intracranial abnormalities. 2. Left posterior parietal scalp hematoma. 3. Chronic small vessel ischemic change and brain atrophy. 4. Right mastoid air cell opacification. 5. No evidence for cervical spine fracture 6. Cervical spine degenerative disc disease. Electronically Signed   By: Signa Kellaylor  Stroud M.D.   On: 01/12/2018 15:23   Ct Pelvis Wo Contrast  Result Date: 01/12/2018 CLINICAL DATA:  Fall today from chair.  Negative plain films. EXAM: CT PELVIS WITHOUT CONTRAST TECHNIQUE: Multidetector CT imaging of the pelvis was performed following the standard protocol without intravenous contrast. COMPARISON:  Pelvis/hip x-rays today. FINDINGS: Urinary Tract:  Within normal. Bowel: Mild diverticulosis of the colon. Moderate segment of sigmoid colon extends into a left inguinal hernia down into the left scrotum. No obstruction. Vascular/Lymphatic: Calcified plaque over the abdominal aorta. No adenopathy. Reproductive:  Unremarkable. Other:  Left inguinal hernia as described above.  No free fluid. Musculoskeletal: No significant degenerative changes of the hips. No acute fracture or dislocation. Minimal degenerate change of the spine. IMPRESSION: No acute fracture. Left inguinal hernia containing a moderate segment of colon which extends down into the left Colonic diverticulosis. Aortic Atherosclerosis (ICD10-I70.0). Scrotum. Electronically Signed   By: Elberta Fortisaniel  Boyle M.D.   On: 01/12/2018 15:24   Dg Chest Port 1 View  Result Date: 01/16/2018 CLINICAL DATA:  Increased shortness of breath. Low-grade fever. Irregular heart rate. EXAM: PORTABLE CHEST 1  VIEW COMPARISON:  Chest x-rays dated 01/12/2018 and 08/12/2013. FINDINGS: Heart size and mediastinal contours are within normal limits. Patchy opacities are seen bilaterally, particularly prominent at the medial aspects of the RIGHT lung base. Coarse interstitial markings bilaterally suggesting interstitial edema. No pleural effusions seen. No pneumothorax seen. Chronic appearing deformity of the distal LEFT clavicle. No acute appearing osseous abnormality. IMPRESSION: 1. Patchy opacities bilaterally, particularly prominent at the medial aspects of the RIGHT lung base, concerning for multifocal pneumonia. 2. Coarse interstitial markings suggesting associated interstitial edema. Electronically Signed   By: Bary RichardStan  Maynard M.D.   On: 01/16/2018 11:09   Dg Knee Complete 4 Views Left  Result Date: 01/12/2018 CLINICAL DATA:  Fall.  Slid out of a chair.  Initial encounter. EXAM: LEFT KNEE - COMPLETE 4+ VIEW COMPARISON:  None. FINDINGS: There is no evidence of acute fracture, dislocation, or knee joint effusion. No significant arthropathic changes are identified. Atherosclerotic vascular calcifications are noted. IMPRESSION: Negative. Electronically Signed   By: Sebastian Ache M.D.   On: 01/12/2018 11:38   Dg Knee Complete 4 Views Right  Result Date: 01/12/2018 CLINICAL DATA:  Fall from chair EXAM: RIGHT KNEE - COMPLETE 4+ VIEW COMPARISON:  None. FINDINGS: Osseous alignment is normal. No fracture line or displaced fracture fragment seen. No acute or suspicious osseous lesion. No appreciable joint effusion and adjacent soft tissues are unremarkable. IMPRESSION: Negative. Electronically Signed   By: Bary Richard M.D.   On: 01/12/2018 11:43   Dg Hips Bilat W Or Wo Pelvis 2 Views  Result Date: 01/12/2018 CLINICAL DATA:  Fall from chair. EXAM: DG HIP (WITH OR WITHOUT PELVIS) 2V BILAT COMPARISON:  None. FINDINGS: Osseous alignment is normal. No fracture line or displaced fracture fragment seen. No degenerative change at  either hip joint. Bowel is noted within the scrotal sac, consistent with large hernia. Soft tissues about the pelvis and hips are otherwise unremarkable. IMPRESSION: 1. No osseous fracture or dislocation. 2. Herniated bowel within the scrotal sac. Electronically Signed   By: Bary Richard M.D.   On: 01/12/2018 11:40         Discharge Exam: Vitals:   01/20/18 0700 01/20/18 0800  BP: (!) 137/94 (!) 142/92  Pulse: (!) 44 95  Resp: 20 (!) 22  Temp:    SpO2: 95% (!) 88%   Vitals:   01/20/18 0500 01/20/18 0600 01/20/18 0700 01/20/18 0800  BP: 138/84 (!) 144/96 (!) 137/94 (!) 142/92  Pulse: 84 92 (!) 44 95  Resp: (!) 22 (!) 24 20 (!) 22  Temp:      TempSrc:      SpO2: 97% 93% 95% (!) 88%  Weight: 58.2 kg     Height:        General: Pt is  not in acute distress Cardiovascular: RRR, S1/S2 +, no rubs, no gallops Respiratory: bibasilar rales, no wheeze Abdominal: Soft, ND, bowel sounds + Extremities: no edema, no cyanosis   The results of significant diagnostics from this hospitalization (including imaging, microbiology, ancillary and laboratory) are listed below for reference.    Significant Diagnostic Studies: Dg Chest 1 View  Result Date: 01/12/2018 CLINICAL DATA:  Fall from chair. EXAM: CHEST  1 VIEW COMPARISON:  Chest x-ray dated 08/12/2013. FINDINGS: Heart size and mediastinal contours are within normal limits. Lungs are clear. No pleural effusion or pneumothorax seen. Osseous structures about the chest are unremarkable. IMPRESSION: No acute findings.  Lungs are clear. Electronically Signed   By: Bary Richard M.D.   On: 01/12/2018 11:44   Ct Head Wo Contrast  Result Date: 01/18/2018 CLINICAL DATA:  Followup intracranial hemorrhage EXAM: CT HEAD WITHOUT CONTRAST TECHNIQUE: Contiguous axial images were obtained from the base of the skull through the vertex without intravenous contrast. COMPARISON:  01/14/2018.  01/12/2018. FINDINGS: Brain: Hemorrhagic contusion of the right  temporal tip as seen previously. No evidence of increased bleeding. Slight worsening of edema. No mass effect or shift. No extra-axial collection. Small amount of subarachnoid blood in the sulci of the right occipital region. Cannot rule out minor hemorrhagic contusion. No brain edema seen however. Small amount of blood dependent in the occipital horn of the right lateral  ventricle. Brain shows a background pattern of atrophy and chronic small-vessel disease. Old right frontal infarction. Vascular: There is atherosclerotic calcification of the major vessels at the base of the brain. Skull: Nondisplaced fracture of the calvarium at the right anterior middle cranial fossa as seen previously. Bilateral zygomatic arch and left lateral orbital wall fractures as seen previously. Sinuses/Orbits: Chronic opacification of the right sphenoid sinus. Mastoid effusion on the right without evidence of right temporal bone fracture. Other: None IMPRESSION: Right temporal tip hemorrhagic contusion without evidence of increased bleeding. Some increase in the amount of edema in that region. Small amount of subarachnoid blood noted in the sulci of the right occipital region, likely secondary to the previous insult. Small amount of blood dependently in the occipital horn of the right lateral ventricle. Electronically Signed   By: Paulina Fusi M.D.   On: 01/18/2018 15:01   Ct Head Wo Contrast  Result Date: 01/14/2018 CLINICAL DATA:  Ataxia post head trauma. Found down. EXAM: CT HEAD WITHOUT CONTRAST CT CERVICAL SPINE WITHOUT CONTRAST TECHNIQUE: Multidetector CT imaging of the head and cervical spine was performed following the standard protocol without intravenous contrast. Multiplanar CT image reconstructions of the cervical spine were also generated. COMPARISON:  Head CT dated 01/12/2018. FINDINGS: CT HEAD FINDINGS Brain: New small focus of acute subdural hemorrhage overlying the anterior margin of the lower RIGHT temporal lobe,  measuring 6 mm thickness. Small amount of associated traumatic subarachnoid hemorrhage within the adjacent sulci. No appreciable edema within the surrounding parenchyma. No significant mass effect. Additional trace subarachnoid hemorrhage within the sulcus of the superior-lateral LEFT temporal lobe, also without evidence of surrounding parenchymal edema or mass effect. There is generalized age related parenchymal volume loss with commensurate dilatation of the ventricles and sulci. Ventricles are stable in size and configuration. Chronic small vessel ischemic changes again noted within the deep periventricular white matter regions bilaterally. Vascular: Chronic calcified atherosclerotic changes of the large vessels at the skull base. No unexpected hyperdense vessel. Skull: New nondisplaced fracture within the RIGHT frontotemporal bone. Additional new nondisplaced fracture within the RIGHT zygomatic arch. Additional nondisplaced fracture within the lateral wall of the LEFT orbit, stable appearance compared to the most recent head CT of 01/12/2018. Stable comminuted fracture of the LEFT zygomatic arch. Sinuses/Orbits: Fluid/mucosal thickening within the posterior ethmoid air cells. Orbital globes appear intact and symmetric in configuration. No retro-orbital hemorrhage. Other: Scalp edema/hematoma overlying the LEFT parietooccipital bone has slightly decreased in size. Incidental note made of a slightly displaced fracture within the posterior wall of the LEFT maxillary sinus, stable compared to the recent head CT. CT CERVICAL SPINE FINDINGS Alignment: Stable dextroscoliosis. No evidence of acute vertebral body subluxation. Skull base and vertebrae: Characterization of osseous detail is slightly limited by patient motion artifact, however, there is no fracture line or displaced fracture fragment seen. Facet joints appear normally aligned. Soft tissues and spinal canal: No prevertebral fluid or swelling. No visible  canal hematoma. Disc levels: Degenerative spondylosis throughout the cervical spine, mild to moderate in degree. No more than mild central canal stenosis at any level. Upper chest: No acute findings. Other: None. IMPRESSION: 1. New small focus of acute subdural hemorrhage overlying the anterior margin of the lower RIGHT temporal lobe, measuring 6 mm thickness. Small amount of associated traumatic subarachnoid hemorrhage within the adjacent sulci. No significant mass effect. 2. Additional trace subarachnoid hemorrhage within a sulcus of the superior-lateral LEFT temporal lobe, also without evidence of surrounding parenchymal edema or mass effect. 3.  New nondisplaced fracture within the RIGHT frontotemporal bone. 4. New nondisplaced fracture within the RIGHT zygomatic arch. 5. Nondisplaced fracture within the lateral wall of the LEFT orbit, likely acute but stable compared to recent head CT of 01/12/2018. 6. Slightly displaced fracture within the posterior wall of the LEFT maxillary sinus. 7. Scalp edema/hematoma over the LEFT parietooccipital bone, slightly decreased in size compared to the previous study of 01/12/2018. 8. No fracture or acute subluxation within the cervical spine. Critical Value/emergent results were called by telephone at the time of interpretation on 01/14/2018 at 12:45 pm to Dr. Onalee Hua Anelis Hrivnak , who verbally acknowledged these results. Electronically Signed   By: Bary Richard M.D.   On: 01/14/2018 13:00   Ct Head Wo Contrast  Result Date: 01/12/2018 CLINICAL DATA:  Patient found face down on floor. Patient states he was looking for is pants. EXAM: CT HEAD WITHOUT CONTRAST CT CERVICAL SPINE WITHOUT CONTRAST TECHNIQUE: Multidetector CT imaging of the head and cervical spine was performed following the standard protocol without intravenous contrast. Multiplanar CT image reconstructions of the cervical spine were also generated. COMPARISON:  None. FINDINGS: CT HEAD FINDINGS Brain: No evidence of acute  infarction, hemorrhage, hydrocephalus, extra-axial collection or mass lesion/mass effect. There is mild diffuse low-attenuation within the subcortical and periventricular white matter compatible with chronic microvascular disease. Prominence of the sulci and ventricles compatible with brain atrophy. Vascular: No hyperdense vessel or unexpected calcification. Skull: Normal. Negative for fracture or focal lesion. Sinuses/Orbits: There is opacification of the right mastoid air cells. Mild mucosal thickening is noted within the left maxillary sinus. The remaining paranasal sinuses and the left mastoid air cells are clear. Other: Left posterior parietal scalp hematoma noted, image 56/5. CT CERVICAL SPINE FINDINGS Alignment: Normal. Skull base and vertebrae: No acute fracture. No primary bone lesion or focal pathologic process. Soft tissues and spinal canal: No prevertebral fluid or swelling. No visible canal hematoma. Disc levels: Ventral endplate spurring and disc space narrowing identified at C3-4 and C5-6. Upper chest: A chronic appearing deformity involving the left shoulder including the distal clavicle and scapula identified. Other: None IMPRESSION: 1. No acute intracranial abnormalities. 2. Left posterior parietal scalp hematoma. 3. Chronic small vessel ischemic change and brain atrophy. 4. Right mastoid air cell opacification. 5. No evidence for cervical spine fracture 6. Cervical spine degenerative disc disease. Electronically Signed   By: Signa Kell M.D.   On: 01/12/2018 15:23   Ct Cervical Spine Wo Contrast  Result Date: 01/14/2018 CLINICAL DATA:  Ataxia post head trauma. Found down. EXAM: CT HEAD WITHOUT CONTRAST CT CERVICAL SPINE WITHOUT CONTRAST TECHNIQUE: Multidetector CT imaging of the head and cervical spine was performed following the standard protocol without intravenous contrast. Multiplanar CT image reconstructions of the cervical spine were also generated. COMPARISON:  Head CT dated  01/12/2018. FINDINGS: CT HEAD FINDINGS Brain: New small focus of acute subdural hemorrhage overlying the anterior margin of the lower RIGHT temporal lobe, measuring 6 mm thickness. Small amount of associated traumatic subarachnoid hemorrhage within the adjacent sulci. No appreciable edema within the surrounding parenchyma. No significant mass effect. Additional trace subarachnoid hemorrhage within the sulcus of the superior-lateral LEFT temporal lobe, also without evidence of surrounding parenchymal edema or mass effect. There is generalized age related parenchymal volume loss with commensurate dilatation of the ventricles and sulci. Ventricles are stable in size and configuration. Chronic small vessel ischemic changes again noted within the deep periventricular white matter regions bilaterally. Vascular: Chronic calcified atherosclerotic changes of the large  vessels at the skull base. No unexpected hyperdense vessel. Skull: New nondisplaced fracture within the RIGHT frontotemporal bone. Additional new nondisplaced fracture within the RIGHT zygomatic arch. Additional nondisplaced fracture within the lateral wall of the LEFT orbit, stable appearance compared to the most recent head CT of 01/12/2018. Stable comminuted fracture of the LEFT zygomatic arch. Sinuses/Orbits: Fluid/mucosal thickening within the posterior ethmoid air cells. Orbital globes appear intact and symmetric in configuration. No retro-orbital hemorrhage. Other: Scalp edema/hematoma overlying the LEFT parietooccipital bone has slightly decreased in size. Incidental note made of a slightly displaced fracture within the posterior wall of the LEFT maxillary sinus, stable compared to the recent head CT. CT CERVICAL SPINE FINDINGS Alignment: Stable dextroscoliosis. No evidence of acute vertebral body subluxation. Skull base and vertebrae: Characterization of osseous detail is slightly limited by patient motion artifact, however, there is no fracture line  or displaced fracture fragment seen. Facet joints appear normally aligned. Soft tissues and spinal canal: No prevertebral fluid or swelling. No visible canal hematoma. Disc levels: Degenerative spondylosis throughout the cervical spine, mild to moderate in degree. No more than mild central canal stenosis at any level. Upper chest: No acute findings. Other: None. IMPRESSION: 1. New small focus of acute subdural hemorrhage overlying the anterior margin of the lower RIGHT temporal lobe, measuring 6 mm thickness. Small amount of associated traumatic subarachnoid hemorrhage within the adjacent sulci. No significant mass effect. 2. Additional trace subarachnoid hemorrhage within a sulcus of the superior-lateral LEFT temporal lobe, also without evidence of surrounding parenchymal edema or mass effect. 3. New nondisplaced fracture within the RIGHT frontotemporal bone. 4. New nondisplaced fracture within the RIGHT zygomatic arch. 5. Nondisplaced fracture within the lateral wall of the LEFT orbit, likely acute but stable compared to recent head CT of 01/12/2018. 6. Slightly displaced fracture within the posterior wall of the LEFT maxillary sinus. 7. Scalp edema/hematoma over the LEFT parietooccipital bone, slightly decreased in size compared to the previous study of 01/12/2018. 8. No fracture or acute subluxation within the cervical spine. Critical Value/emergent results were called by telephone at the time of interpretation on 01/14/2018 at 12:45 pm to Dr. Onalee Hua Keyshia Orwick , who verbally acknowledged these results. Electronically Signed   By: Bary Richard M.D.   On: 01/14/2018 13:00   Ct Cervical Spine Wo Contrast  Result Date: 01/12/2018 CLINICAL DATA:  Patient found face down on floor. Patient states he was looking for is pants. EXAM: CT HEAD WITHOUT CONTRAST CT CERVICAL SPINE WITHOUT CONTRAST TECHNIQUE: Multidetector CT imaging of the head and cervical spine was performed following the standard protocol without intravenous  contrast. Multiplanar CT image reconstructions of the cervical spine were also generated. COMPARISON:  None. FINDINGS: CT HEAD FINDINGS Brain: No evidence of acute infarction, hemorrhage, hydrocephalus, extra-axial collection or mass lesion/mass effect. There is mild diffuse low-attenuation within the subcortical and periventricular white matter compatible with chronic microvascular disease. Prominence of the sulci and ventricles compatible with brain atrophy. Vascular: No hyperdense vessel or unexpected calcification. Skull: Normal. Negative for fracture or focal lesion. Sinuses/Orbits: There is opacification of the right mastoid air cells. Mild mucosal thickening is noted within the left maxillary sinus. The remaining paranasal sinuses and the left mastoid air cells are clear. Other: Left posterior parietal scalp hematoma noted, image 56/5. CT CERVICAL SPINE FINDINGS Alignment: Normal. Skull base and vertebrae: No acute fracture. No primary bone lesion or focal pathologic process. Soft tissues and spinal canal: No prevertebral fluid or swelling. No visible canal hematoma. Disc levels: Ventral  endplate spurring and disc space narrowing identified at C3-4 and C5-6. Upper chest: A chronic appearing deformity involving the left shoulder including the distal clavicle and scapula identified. Other: None IMPRESSION: 1. No acute intracranial abnormalities. 2. Left posterior parietal scalp hematoma. 3. Chronic small vessel ischemic change and brain atrophy. 4. Right mastoid air cell opacification. 5. No evidence for cervical spine fracture 6. Cervical spine degenerative disc disease. Electronically Signed   By: Signa Kell M.D.   On: 01/12/2018 15:23   Ct Pelvis Wo Contrast  Result Date: 01/12/2018 CLINICAL DATA:  Fall today from chair.  Negative plain films. EXAM: CT PELVIS WITHOUT CONTRAST TECHNIQUE: Multidetector CT imaging of the pelvis was performed following the standard protocol without intravenous contrast.  COMPARISON:  Pelvis/hip x-rays today. FINDINGS: Urinary Tract:  Within normal. Bowel: Mild diverticulosis of the colon. Moderate segment of sigmoid colon extends into a left inguinal hernia down into the left scrotum. No obstruction. Vascular/Lymphatic: Calcified plaque over the abdominal aorta. No adenopathy. Reproductive:  Unremarkable. Other:  Left inguinal hernia as described above.  No free fluid. Musculoskeletal: No significant degenerative changes of the hips. No acute fracture or dislocation. Minimal degenerate change of the spine. IMPRESSION: No acute fracture. Left inguinal hernia containing a moderate segment of colon which extends down into the left Colonic diverticulosis. Aortic Atherosclerosis (ICD10-I70.0). Scrotum. Electronically Signed   By: Elberta Fortis M.D.   On: 01/12/2018 15:24   Dg Chest Port 1 View  Result Date: 01/16/2018 CLINICAL DATA:  Increased shortness of breath. Low-grade fever. Irregular heart rate. EXAM: PORTABLE CHEST 1 VIEW COMPARISON:  Chest x-rays dated 01/12/2018 and 08/12/2013. FINDINGS: Heart size and mediastinal contours are within normal limits. Patchy opacities are seen bilaterally, particularly prominent at the medial aspects of the RIGHT lung base. Coarse interstitial markings bilaterally suggesting interstitial edema. No pleural effusions seen. No pneumothorax seen. Chronic appearing deformity of the distal LEFT clavicle. No acute appearing osseous abnormality. IMPRESSION: 1. Patchy opacities bilaterally, particularly prominent at the medial aspects of the RIGHT lung base, concerning for multifocal pneumonia. 2. Coarse interstitial markings suggesting associated interstitial edema. Electronically Signed   By: Bary Richard M.D.   On: 01/16/2018 11:09   Dg Knee Complete 4 Views Left  Result Date: 01/12/2018 CLINICAL DATA:  Fall.  Slid out of a chair.  Initial encounter. EXAM: LEFT KNEE - COMPLETE 4+ VIEW COMPARISON:  None. FINDINGS: There is no evidence of acute  fracture, dislocation, or knee joint effusion. No significant arthropathic changes are identified. Atherosclerotic vascular calcifications are noted. IMPRESSION: Negative. Electronically Signed   By: Sebastian Ache M.D.   On: 01/12/2018 11:38   Dg Knee Complete 4 Views Right  Result Date: 01/12/2018 CLINICAL DATA:  Fall from chair EXAM: RIGHT KNEE - COMPLETE 4+ VIEW COMPARISON:  None. FINDINGS: Osseous alignment is normal. No fracture line or displaced fracture fragment seen. No acute or suspicious osseous lesion. No appreciable joint effusion and adjacent soft tissues are unremarkable. IMPRESSION: Negative. Electronically Signed   By: Bary Richard M.D.   On: 01/12/2018 11:43   Dg Hips Bilat W Or Wo Pelvis 2 Views  Result Date: 01/12/2018 CLINICAL DATA:  Fall from chair. EXAM: DG HIP (WITH OR WITHOUT PELVIS) 2V BILAT COMPARISON:  None. FINDINGS: Osseous alignment is normal. No fracture line or displaced fracture fragment seen. No degenerative change at either hip joint. Bowel is noted within the scrotal sac, consistent with large hernia. Soft tissues about the pelvis and hips are otherwise unremarkable. IMPRESSION: 1.  No osseous fracture or dislocation. 2. Herniated bowel within the scrotal sac. Electronically Signed   By: Bary Richard M.D.   On: 01/12/2018 11:40     Microbiology: Recent Results (from the past 240 hour(s))  MRSA PCR Screening     Status: None   Collection Time: 01/12/18  9:16 PM  Result Value Ref Range Status   MRSA by PCR NEGATIVE NEGATIVE Final    Comment:        The GeneXpert MRSA Assay (FDA approved for NASAL specimens only), is one component of a comprehensive MRSA colonization surveillance program. It is not intended to diagnose MRSA infection nor to guide or monitor treatment for MRSA infections. Performed at [facility], [address] [place], [place] [address]   MRSA PCR Screening     Status: None   Collection Time: 01/16/18  7:45 PM  Result Value Ref Range  Status   MRSA by PCR NEGATIVE NEGATIVE Final    Comment:        The GeneXpert MRSA Assay (FDA approved for NASAL specimens only), is one component of a comprehensive MRSA colonization surveillance program. It is not intended to diagnose MRSA infection nor to guide or monitor treatment for MRSA infections. Performed at [facility], [address], [place], [place] [address]      Labs: Basic Metabolic Panel: Recent Labs  Lab 01/14/18 1215 01/15/18 0502 01/16/18 1033 01/17/18 0529 01/17/18 0530 01/18/18 0408 01/19/18 0426  NA 137 136 136  --  139 140 143  K 4.1 3.6 3.7  --  3.8 3.5 4.1  CL 105 105 108  --  110 112* 110  CO2 24 22 15*  --  19* 21* 26  GLUCOSE 101* 119* 89  --  113* 100* 145*  BUN 10 10 19   --  25* 21 13  CREATININE 1.04 0.87 1.26*  --  1.17 0.97 0.74  CALCIUM 8.3* 8.1* 8.1*  --  8.1* 7.9* 8.0*  MG 1.9  --   --  1.9  --   --   --    Liver Function Tests: Recent Labs  Lab 01/17/18 0530 01/18/18 0408  AST 36 27  ALT 27 25  ALKPHOS 58 54  BILITOT 1.8* 1.3*  PROT 5.6* 5.2*  ALBUMIN 2.4* 2.0*   No results for input(s): LIPASE, AMYLASE in the last 168 hours. Recent Labs  Lab 01/17/18 0530  AMMONIA 15   CBC: Recent Labs  Lab 01/15/18 0502 01/16/18 1033 01/17/18 0529 01/18/18 0408 01/19/18 0426  WBC 7.9 8.9 8.9 7.8 7.9  HGB 11.1* 11.8* 10.4* 9.1* 9.9*  HCT 33.8* 36.6* 31.3* 27.9* 30.6*  MCV 89.9 92.7 89.9 93.0 92.4  PLT 319 326 339 297 303   Cardiac Enzymes: Recent Labs  Lab 01/14/18 1215 01/15/18 0502 01/16/18 1033  CKTOTAL 893* 786* 397   BNP: Invalid input(s): POCBNP CBG: No results for input(s): GLUCAP in the last 168 hours.  Time coordinating discharge:  36 minutes  Signed:  Catarina Hartshorn, DO [facility] Pager: [phone] 01/20/2018, 1:25 PM

## 2018-01-20 NOTE — Clinical Social Work Note (Signed)
[facility] confirms receipt of referral. She will call back to identify bed availability.    Samiya Mervin, Juleen ChinaHeather D, LCSW

## 2018-01-20 NOTE — Progress Notes (Signed)
Dr. Alvester MorinNewton paged to see if pt could have foley catheter placed d/t him being on comfort care.  New order for foley placement.

## 2018-01-20 NOTE — Plan of Care (Signed)
  Problem: Education: Goal: Knowledge of General Education information will improve Description Including pain rating scale, medication(s)/side effects and non-pharmacologic comfort measures Outcome: Completed/Met   Problem: Health Behavior/Discharge Planning: Goal: Ability to manage health-related needs will improve Outcome: Completed/Met

## 2018-01-20 NOTE — Progress Notes (Signed)
Daily Progress Note   Patient Name: Thomas Foley       Date: 01/20/2018 DOB: Jun 28, 1945  Age: 72 y.o. MRN#: [id number] Attending Physician: Catarina Hartshorn, MD Primary Care Physician: Avon Gully, MD Admit Date: 01/12/2018  Reason for Consultation/Follow-up: Establishing goals of care and Terminal Care  Subjective: Patient unresponsive to sternal rub but appears comfortable without pain or distress. Non-labored, regular respirations.   No family/friends at bedside.   Length of Stay: 6  Current Medications: Scheduled Meds:  . chlorhexidine  15 mL Mouth Rinse BID  . mouth rinse  15 mL Mouth Rinse q12n4p    Continuous Infusions: . sodium chloride 10 mL/hr at 01/18/18 0943    PRN Meds: sodium chloride, acetaminophen **OR** acetaminophen, albuterol, bisacodyl, glycopyrrolate, haloperidol lactate, LORazepam, morphine injection, ondansetron **OR** ondansetron (ZOFRAN) IV  Physical Exam  Constitutional: He appears cachectic.  HENT:  Head: Normocephalic and atraumatic.  Cardiovascular: Regular rhythm.  Pulmonary/Chest: No accessory muscle usage. No tachypnea. No respiratory distress.  Abdominal: There is no tenderness.  Neurological: He is unresponsive.  Skin: Skin is warm and dry. There is pallor.  Nursing note and vitals reviewed.          Vital Signs: BP (!) 142/92   Pulse 95   Temp (!) 97.4 F (36.3 C) (Axillary)   Resp (!) 22   Ht 6' (1.829 m)   Wt 58.2 kg   SpO2 (!) 88%   BMI 17.40 kg/m  SpO2: SpO2: (!) 88 % O2 Device: O2 Device: Room Air O2 Flow Rate: O2 Flow Rate (L/min): 2 L/min  Intake/output summary:   Intake/Output Summary (Last 24 hours) at 01/20/2018 0944 Last data filed at 01/20/2018 0300 Gross per 24 hour  Intake 42.8 ml  Output 750 ml  Net -707.2 ml     LBM: Last BM Date: 01/16/18 Baseline Weight: Weight: 60.5 kg Most recent weight: Weight: 58.2 kg       Palliative Assessment/Data: PPS 10%   Flowsheet Rows     Most Recent Value  Intake Tab  Referral Department  Hospitalist  Unit at Time of Referral  ICU  Palliative Care Primary Diagnosis  Neurology  Palliative Care Type  New Palliative care  Date first seen by Palliative Care  01/14/18  Clinical Assessment  Palliative Performance Scale Score  10%  Psychosocial &  Spiritual Assessment  Palliative Care Outcomes  Patient/Family meeting held?  Yes  Who was at the meeting?  spoke with DSS legal guardian  Palliative Care Outcomes  Clarified goals of care, Counseled regarding hospice, Provided end of life care assistance, Improved pain interventions, Improved non-pain symptom therapy, Provided psychosocial or spiritual support, Changed to focus on comfort, Transitioned to hospice, ACP counseling assistance      Patient Active Problem List   Diagnosis Date Noted  . Palliative care by specialist   . Fall   . Dementia with behavioral disturbance (HCC)   . Aspiration pneumonia of both lower lobes due to gastric secretions (HCC) 01/16/2018  . Atrial fibrillation with RVR (HCC) 01/16/2018  . Goals of care, counseling/discussion 01/16/2018  . PSVT (paroxysmal supraventricular tachycardia) (HCC) 01/15/2018  . Subdural hematoma (HCC) 01/14/2018  . Subarachnoid hemorrhage (HCC) 01/14/2018  . Zygomatic fracture, right side, subsequent encounter for fracture with routine healing 01/14/2018  . Multiple falls 01/12/2018  . Pressure injury of skin 01/12/2018    Palliative Care Assessment & Plan   Patient Profile: 72 y.o. male  with past medical history of dementia, COPD, and GERD admitted on 01/12/2018 after unwitnessed fall from group home. Baseline, patient ambulatory but confused and requiring assistance with ADL's. Course of hospitalization has been complicated by mechanical fall  morning of 01/14/18. CT head revealed new small subdural hemorrhage measuring 6mm in right temporal lobe and trace subdural hemorrhage in lateral temporal lobe on left. Also nondisplaced mandibular fracture right frontal temporal bone and right zygomatic arch and nondisplaced fracture of lateral wall of left orbit and left maxillary sinus. Neurosurgery and ENT recommending conservative, non-operative management. Patient clinically declined on 12/6 found to be in afib RVR and with healthcare associated pneumonia. Transferred to SDU/ICU. Patient has legal guardian through [place] [employer]. Palliative medicine consultation for goals of care/terminal care.   Assessment: Aspiration pneumonia/HCAP Atrial fibrillation with RVR Subdural hematoma/subarachnoid hemorrhage Dementia Recurrent falls Acute metabolic encephalopathy Facial fractures/maxillary sinus fracture Adult failure to thrive  Recommendations/Plan:  Comfort measures only.   Continue current medication regimen to ensure comfort and relief from suffering.   Pending hospice facility transfer if patient remains stable for transfer. SW following.   Goals of Care and Additional Recommendations:  Limitations on Scope of Treatment: Full Comfort Care  Code Status: DNR/DNI   Code Status Orders  (From admission, onward)         Start     Ordered   01/16/18 1553  Do not attempt resuscitation (DNR)  Continuous    Question Answer Comment  In the event of cardiac or respiratory ARREST Do not call a "code blue"   In the event of cardiac or respiratory ARREST Do not perform Intubation, CPR, defibrillation or ACLS   In the event of cardiac or respiratory ARREST Use medication by any route, position, wound care, and other measures to relive pain and suffering. May use oxygen, suction and manual treatment of airway obstruction as needed for comfort.      01/16/18 1552        Code Status History    Date Active Date Inactive Code Status Order  ID Comments User Context   01/12/2018 2059 01/16/2018 1552 Full Code [id number]  Onnie Boer, MD Inpatient       Prognosis:   < 2 weeks  Discharge Planning:  Hospice facility vs. Hospital death  Care plan was discussed with RN  Thank you for allowing the Palliative Medicine Team to assist in  the care of this patient.   Time In: 0930 Time Out: 0945 Total Time 15 Prolonged Time Billed  no       Greater than 50%  of this time was spent counseling and coordinating care related to the above assessment and plan.  Vennie HomansMegan Erleen Egner, FNP-C Palliative Medicine Team  Phone: [phone] Fax: [phone]  Please contact Palliative Medicine Team phone at [phone] for questions and concerns.

## 2018-02-11 DEATH — deceased

## 2019-03-22 IMAGING — CT CT HEAD W/O CM
3 series · 14 of 47 positions shown, 16 images · non-contrast
Comparison: 01/14/2018.  01/12/2018.

CLINICAL DATA: Followup intracranial hemorrhage

EXAM:
CT HEAD WITHOUT CONTRAST
TECHNIQUE: Contiguous axial images were obtained from the base of the skull
through the vertex without intravenous contrast.

[Series 2: head wo · axial · 0.45mm/px · z∈[+74,+209]mm · 8 of 33 slices shown, 10 images]
[im 3/33  brain]
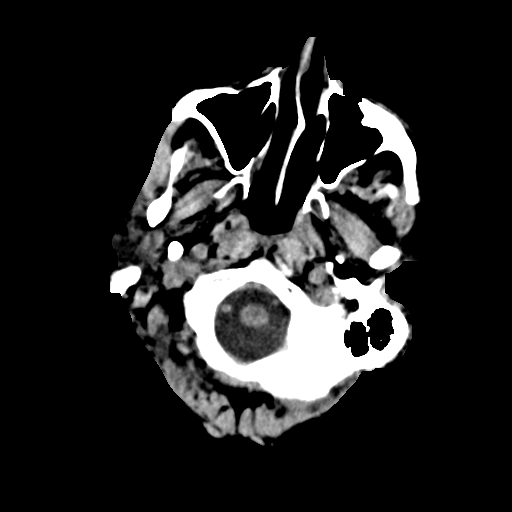
[im 3/33  bone]
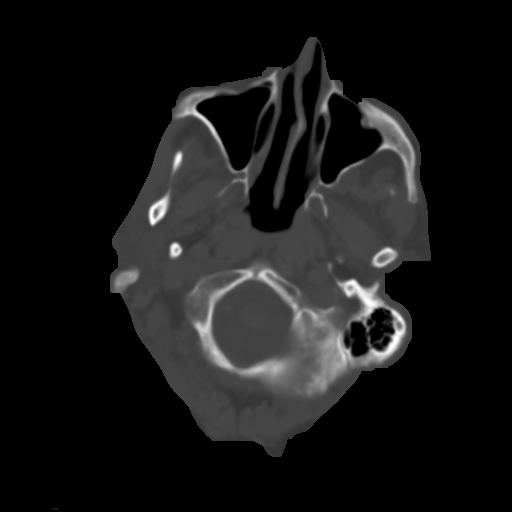
[im 7/33  brain]
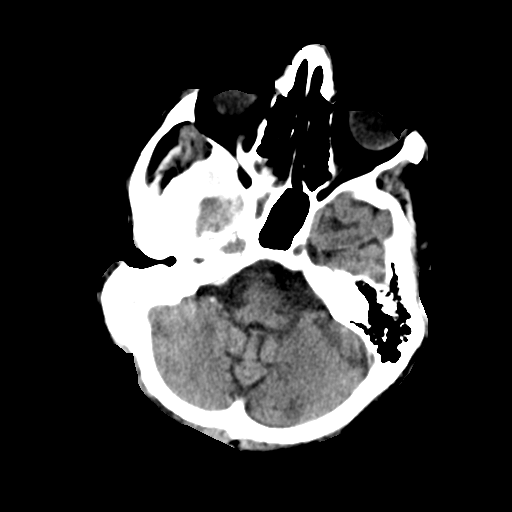
[im 10/33  brain]
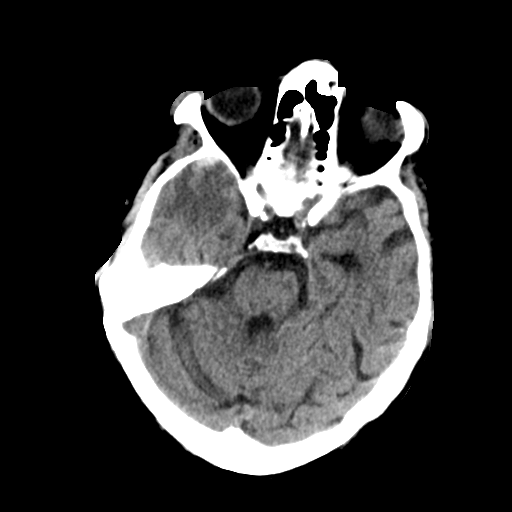
[im 15/33  brain]
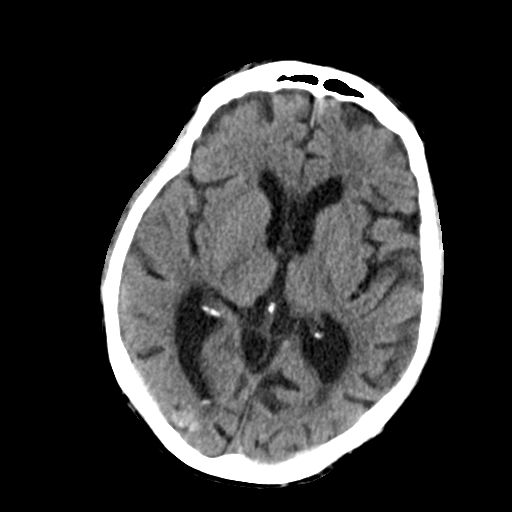
[im 18/33  brain]
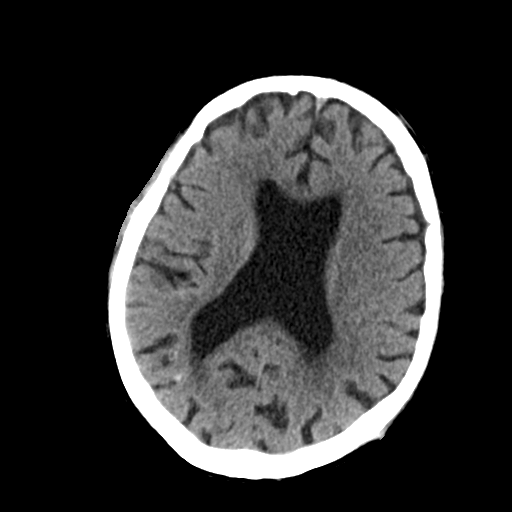
[im 18/33  bone]
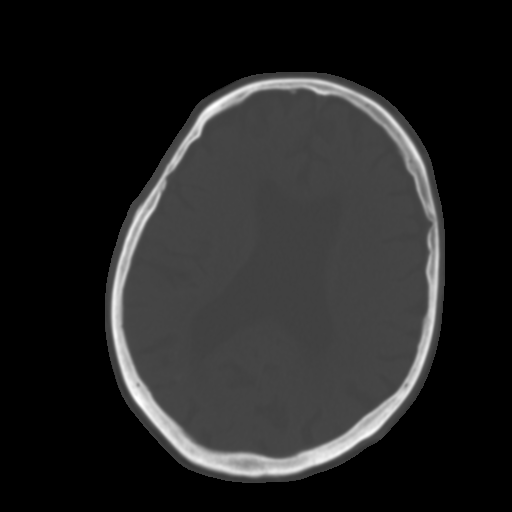
[im 23/33  brain]
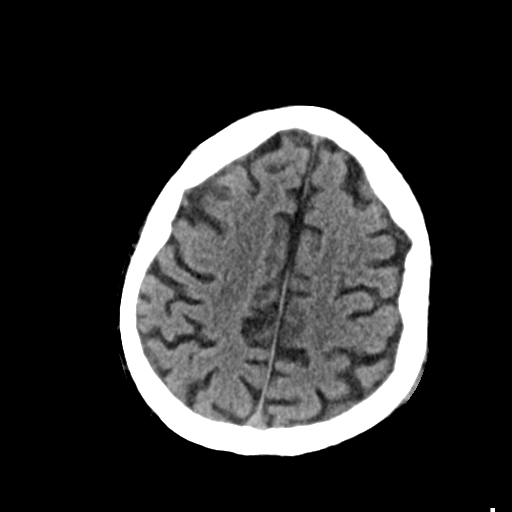
[im 26/33  brain]
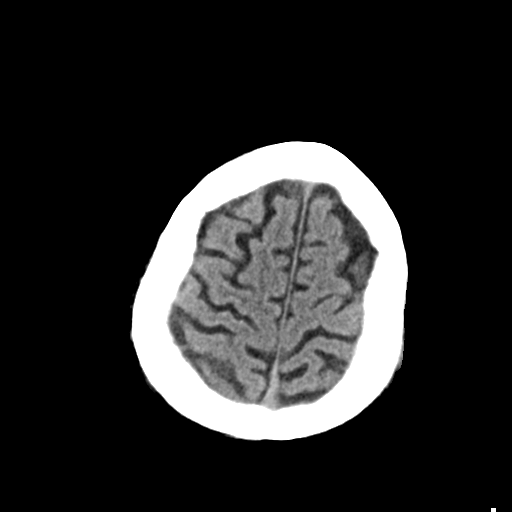
[im 30/33  brain]
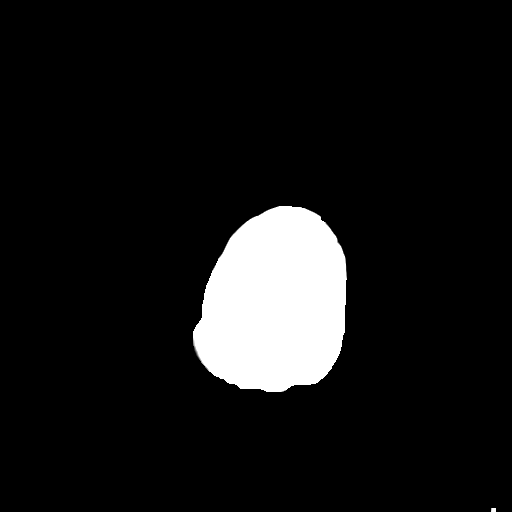

[Series 4: coronal soft tissue · coronal · 0.34mm/px · 3 of 76 slices shown]
[im 26/76  brain]
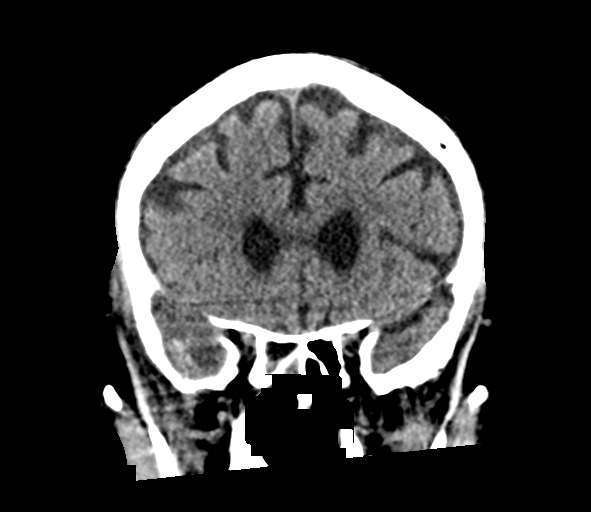
[im 34/76  brain]
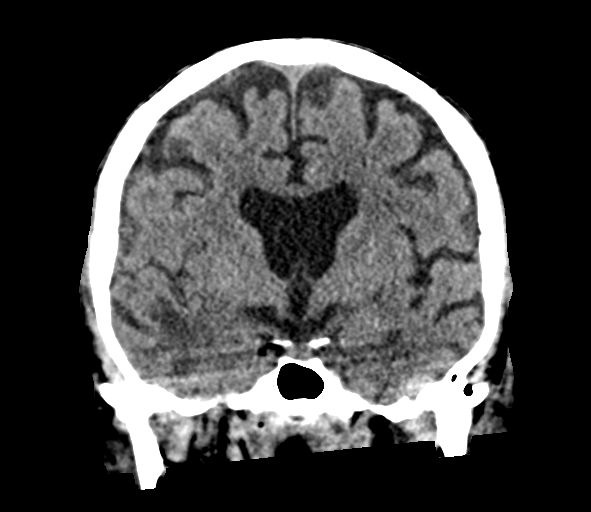
[im 42/76  brain]
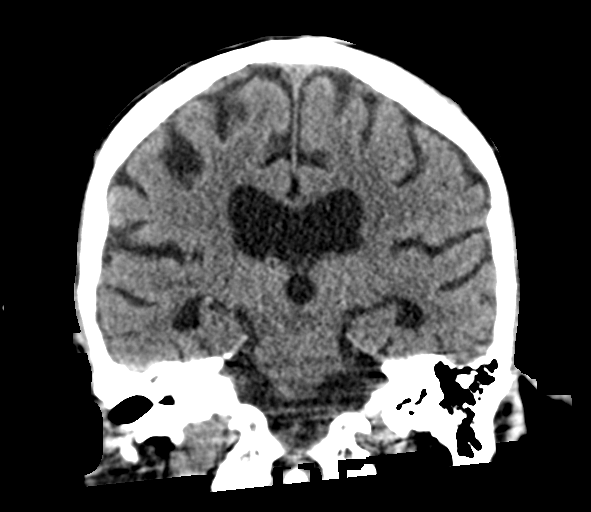

[Series 5: sagittal soft tissue · sagittal · 0.33mm/px · 3 of 64 slices shown]
[im 28/64  brain]
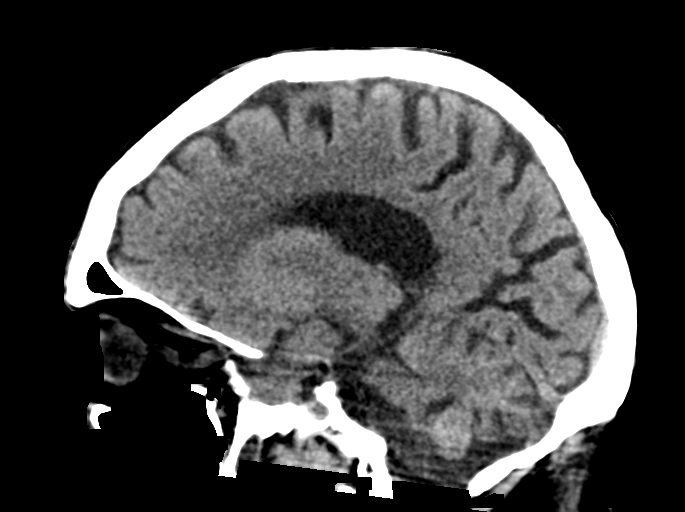
[im 34/64  brain]
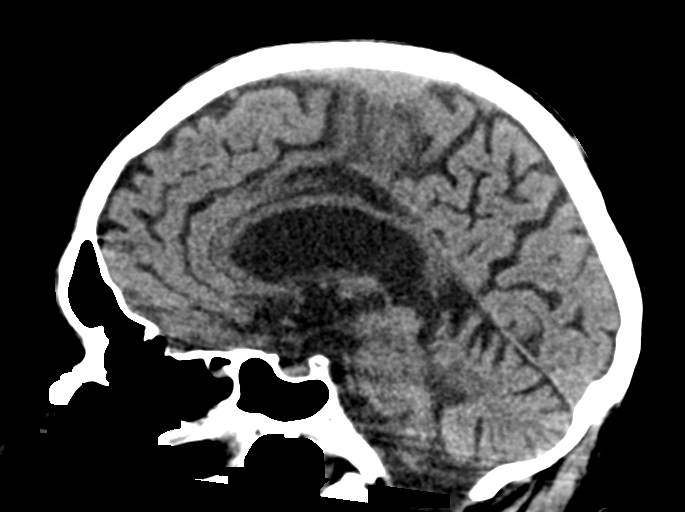
[im 40/64  brain]
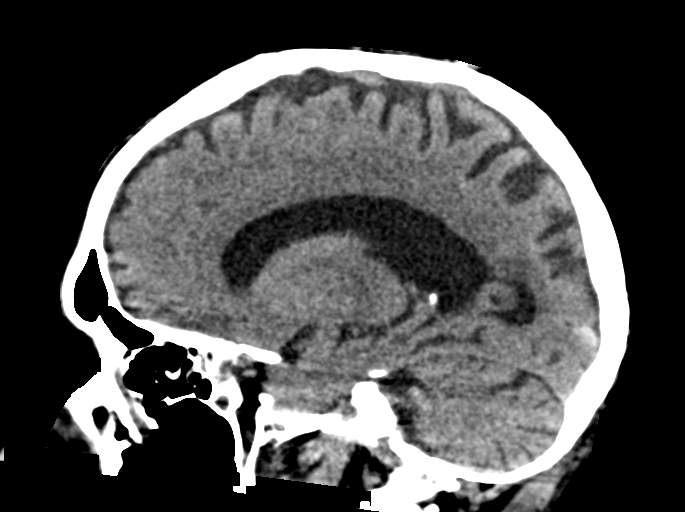

[14 of 47 positions shown; findings below may reference images not displayed]

FINDINGS: Brain: Hemorrhagic contusion of the right temporal tip as seen
previously. No evidence of increased bleeding. Slight worsening of
edema. No mass effect or shift. No extra-axial collection. Small
amount of subarachnoid blood in the sulci of the right occipital
region. Cannot rule out minor hemorrhagic contusion. No brain edema
seen however. Small amount of blood dependent in the occipital horn
of the right lateral ventricle. Brain shows a background pattern of
atrophy and chronic small-vessel disease. Old right frontal
infarction.

Vascular: There is atherosclerotic calcification of the major
vessels at the base of the brain.

Skull: Nondisplaced fracture of the calvarium at the right anterior
middle cranial fossa as seen previously. Bilateral zygomatic arch
and left lateral orbital wall fractures as seen previously.

Sinuses/Orbits: Chronic opacification of the right sphenoid sinus.
Mastoid effusion on the right without evidence of right temporal
bone fracture.

Other: None
IMPRESSION: Right temporal tip hemorrhagic contusion without evidence of
increased bleeding. Some increase in the amount of edema in that
region.

Small amount of subarachnoid blood noted in the sulci of the right
occipital region, likely secondary to the previous insult. Small
amount of blood dependently in the occipital horn of the right
lateral ventricle.
